# Patient Record
Sex: Female | Born: 1985
Health system: Southern US, Community
[De-identification: ages and names within clinical notes are randomized; demographics above are authoritative.]

## PROBLEM LIST (undated history)

## (undated) ENCOUNTER — Inpatient Hospital Stay (HOSPITAL_COMMUNITY): Payer: Self-pay

## (undated) DIAGNOSIS — R519 Headache, unspecified: Secondary | ICD-10-CM

## (undated) DIAGNOSIS — N390 Urinary tract infection, site not specified: Secondary | ICD-10-CM

## (undated) DIAGNOSIS — A599 Trichomoniasis, unspecified: Secondary | ICD-10-CM

## (undated) DIAGNOSIS — L732 Hidradenitis suppurativa: Secondary | ICD-10-CM

## (undated) DIAGNOSIS — G40909 Epilepsy, unspecified, not intractable, without status epilepticus: Secondary | ICD-10-CM

## (undated) HISTORY — PX: BARTHOLIN CYST MARSUPIALIZATION: SHX5383

---
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
## 2012-01-02 ENCOUNTER — Encounter (HOSPITAL_COMMUNITY): Payer: Self-pay | Admitting: *Deleted

## 2012-01-02 ENCOUNTER — Emergency Department (HOSPITAL_COMMUNITY)
Admission: EM | Admit: 2012-01-02 | Discharge: 2012-01-02 | Disposition: A | Discharge status: Home / Self Care | Payer: Medicaid Other | Attending: Emergency Medicine | Admitting: Emergency Medicine

## 2012-01-02 DIAGNOSIS — N390 Urinary tract infection, site not specified: Secondary | ICD-10-CM | POA: Insufficient documentation

## 2012-01-02 DIAGNOSIS — G40909 Epilepsy, unspecified, not intractable, without status epilepticus: Secondary | ICD-10-CM | POA: Insufficient documentation

## 2012-01-02 HISTORY — DX: Epilepsy, unspecified, not intractable, without status epilepticus: G40.909

## 2012-01-02 LAB — URINE MICROSCOPIC-ADD ON

## 2012-01-02 LAB — URINALYSIS, ROUTINE W REFLEX MICROSCOPIC
Bilirubin Urine: NEGATIVE
Glucose, UA: NEGATIVE mg/dL
Hgb urine dipstick: NEGATIVE
Ketones, ur: 15 mg/dL — AB
Nitrite: NEGATIVE
Specific Gravity, Urine: 1.034 — ABNORMAL HIGH (ref 1.005–1.030)
pH: 6.5 (ref 5.0–8.0)

## 2012-01-02 MED ORDER — CIPROFLOXACIN HCL 500 MG PO TABS: 500.0000 mg | ORAL_TABLET | Freq: Two times a day (BID) | ORAL | Status: AC

## 2012-01-02 NOTE — ED Notes (Signed)
Patient C/O left flank pain for 2 days. States that it gets worse with exertion. Denies Hematuria.

## 2012-01-02 NOTE — Discharge Instructions (Signed)

## 2012-01-02 NOTE — ED Notes (Signed)
Patient with complaints of lower back pain and nausea for 2 to 3 days.  She reports her last period was may 1st.  Patient is not on birth control

## 2012-01-02 NOTE — ED Provider Notes (Signed)
History  This chart was scribed for Shannon Quarry, MD by Bennett Scrape. This patient was seen in room STRE7/STRE7 and the patient's care was started at 1:54PM.  CSN: [id number]  Arrival date & time 01/02/12  1339   First MD Initiated Contact with Patient 01/02/12 1354      Chief Complaint  Patient presents with  . Back Pain  . Nausea    The history is provided by the patient. No language interpreter was used.    Shannon Velasquez is a 26 y.o. female who presents to the Emergency Department complaining of 3 days of gradual onset, gradually worsening, interment left-sided lower back pain with associated intermittent nausea. The pain comes on with movement. She has not taken any OTC medications to improve her pain. She denies having prior episodes. Pt reports that her LNMP was 11/19/11 but she denies the possibility of being pregnant. She denies being on birth control currently and is sexually active but denies recent intercourse She reports G1P1 and states that she is not having similar pregnancy symptoms that she experienced with her first pregnancy. She denies emesis and urinary symptoms. She has a h/o epilepsy. She states that she was taking phenobarbital but was taken off of her medication last month. She denies smoking and alcohol use.  Dr. Vonna Kotyk is PCP  Past Medical History  Diagnosis Date  . Epilepsy     History reviewed. No pertinent past surgical history.  No family history on file.  History  Substance Use Topics  . Smoking status: Never Smoker   . Smokeless tobacco: Not on file  . Alcohol Use: No     Review of Systems  Constitutional: Negative for fever and chills.  Gastrointestinal: Positive for nausea. Negative for vomiting.  Musculoskeletal: Positive for back pain.    Allergies  Penicillins  Home Medications  No current outpatient prescriptions on file.  Triage Vitals: BP 112/70  Pulse 96  Temp 97.9 F (36.6 C) (Oral)  Resp 18  Ht 5\' 3"  (1.6 m)   Wt 190 lb (86.183 kg)  BMI 33.66 kg/m2  SpO2 98%  Physical Exam  Nursing note and vitals reviewed. Constitutional: She is oriented to person, place, and time. She appears well-developed and well-nourished. No distress.  HENT:  Head: Normocephalic and atraumatic.  Eyes: EOM are normal.  Neck: Neck supple. No tracheal deviation present.  Cardiovascular: Normal rate.   Pulmonary/Chest: Effort normal. No respiratory distress.  Musculoskeletal: Normal range of motion.  Neurological: She is alert and oriented to person, place, and time.  Skin: Skin is warm and dry.  Psychiatric: She has a normal mood and affect. Her behavior is normal.    ED Course  Procedures (including critical care time)  DIAGNOSTIC STUDIES: Oxygen Saturation is 98% on room air, normal by my interpretation.    COORDINATION OF CARE: 2:00PM-Discussed treatment plan with pt and pt agreed to plan.  Labs Reviewed  URINALYSIS, ROUTINE W REFLEX MICROSCOPIC - Abnormal; Notable for the following:    APPearance CLOUDY (*)     Specific Gravity, Urine 1.034 (*)     Ketones, ur 15 (*)     Leukocytes, UA LARGE (*)     All other components within normal limits  URINE MICROSCOPIC-ADD ON - Abnormal; Notable for the following:    Squamous Epithelial / LPF MANY (*)     Bacteria, UA MANY (*)     All other components within normal limits  POCT PREGNANCY, URINE   No results found.  No diagnosis found.    Possible uti with patient with flank pain- plan cipro.  Patient with negative pregnancy test.   I personally performed the services described in this documentation, which was scribed in my presence. The recorded information has been reviewed and considered.   Shannon Quarry, MD 01/02/12 [phone]

## 2012-01-02 NOTE — ED Notes (Signed)
Pt discharged home. Had no further questions at the time. 

## 2012-04-30 ENCOUNTER — Emergency Department (HOSPITAL_BASED_OUTPATIENT_CLINIC_OR_DEPARTMENT_OTHER)
Admission: EM | Admit: 2012-04-30 | Discharge: 2012-04-30 | Disposition: A | Discharge status: Home / Self Care | Payer: Medicaid Other | Attending: Emergency Medicine | Admitting: Emergency Medicine

## 2012-04-30 ENCOUNTER — Encounter (HOSPITAL_BASED_OUTPATIENT_CLINIC_OR_DEPARTMENT_OTHER): Payer: Self-pay | Admitting: *Deleted

## 2012-04-30 DIAGNOSIS — N926 Irregular menstruation, unspecified: Secondary | ICD-10-CM | POA: Insufficient documentation

## 2012-04-30 DIAGNOSIS — M549 Dorsalgia, unspecified: Secondary | ICD-10-CM | POA: Insufficient documentation

## 2012-04-30 DIAGNOSIS — R110 Nausea: Secondary | ICD-10-CM | POA: Insufficient documentation

## 2012-04-30 LAB — URINALYSIS, ROUTINE W REFLEX MICROSCOPIC
Glucose, UA: NEGATIVE mg/dL
Ketones, ur: NEGATIVE mg/dL
Leukocytes, UA: NEGATIVE
Protein, ur: NEGATIVE mg/dL
Urobilinogen, UA: 1 mg/dL (ref 0.0–1.0)

## 2012-04-30 NOTE — ED Notes (Signed)
Back pain and nausea. Irregular menses. States she took a pregnancy test and it was negative.

## 2012-04-30 NOTE — ED Provider Notes (Signed)
Pt left from ED prior to my evaluation  Rolan Bucco, MD 04/30/12 [phone]

## 2012-04-30 NOTE — ED Notes (Signed)
Pt ambulated to desk stating that the rest of her party was ready to go so she would be leaving now and return if pain worsened. NAD noted, pt ambulated out of dept with a steady gait.

## 2012-10-20 ENCOUNTER — Emergency Department (HOSPITAL_BASED_OUTPATIENT_CLINIC_OR_DEPARTMENT_OTHER)
Admission: EM | Admit: 2012-10-20 | Discharge: 2012-10-20 | Disposition: A | Discharge status: Home / Self Care | Payer: Medicaid Other | Attending: Emergency Medicine | Admitting: Emergency Medicine

## 2012-10-20 ENCOUNTER — Encounter (HOSPITAL_BASED_OUTPATIENT_CLINIC_OR_DEPARTMENT_OTHER): Payer: Self-pay | Admitting: *Deleted

## 2012-10-20 DIAGNOSIS — Z8669 Personal history of other diseases of the nervous system and sense organs: Secondary | ICD-10-CM | POA: Insufficient documentation

## 2012-10-20 DIAGNOSIS — M549 Dorsalgia, unspecified: Secondary | ICD-10-CM | POA: Insufficient documentation

## 2012-10-20 DIAGNOSIS — Z3202 Encounter for pregnancy test, result negative: Secondary | ICD-10-CM | POA: Insufficient documentation

## 2012-10-20 DIAGNOSIS — M546 Pain in thoracic spine: Secondary | ICD-10-CM | POA: Insufficient documentation

## 2012-10-20 DIAGNOSIS — N390 Urinary tract infection, site not specified: Secondary | ICD-10-CM | POA: Insufficient documentation

## 2012-10-20 LAB — URINALYSIS, ROUTINE W REFLEX MICROSCOPIC
Bilirubin Urine: NEGATIVE
Hgb urine dipstick: NEGATIVE
Ketones, ur: 15 mg/dL — AB
Specific Gravity, Urine: 1.03 (ref 1.005–1.030)
Urobilinogen, UA: 1 mg/dL (ref 0.0–1.0)
pH: 7.5 (ref 5.0–8.0)

## 2012-10-20 LAB — URINE MICROSCOPIC-ADD ON

## 2012-10-20 MED ORDER — IBUPROFEN 800 MG PO TABS: 800.0000 mg | ORAL_TABLET | Freq: Three times a day (TID) | ORAL | Status: DC

## 2012-10-20 MED ORDER — CEPHALEXIN 500 MG PO CAPS: 500.0000 mg | ORAL_CAPSULE | Freq: Four times a day (QID) | ORAL | Status: DC

## 2012-10-20 NOTE — ED Notes (Signed)
Pt c/o upper back pain x 1 week

## 2012-10-20 NOTE — ED Provider Notes (Signed)
History     CSN: [id number]  Arrival date & time 10/20/12  1903   First MD Initiated Contact with Patient 10/20/12 1911      Chief Complaint  Patient presents with  . Back Pain    (Consider location/radiation/quality/duration/timing/severity/associated sxs/prior treatment) Patient is a 27 y.o. female presenting with back pain. The history is provided by the patient. No language interpreter was used.  Back Pain Location:  Thoracic spine and lumbar spine Quality:  Aching Pain severity:  Moderate Pain is:  Same all the time Duration:  1 week Timing:  Constant Progression:  Worsening Chronicity:  New Worsened by:  Nothing tried Ineffective treatments:  None tried Associated symptoms: no abdominal pain     Past Medical History  Diagnosis Date  . Epilepsy     History reviewed. No pertinent past surgical history.  History reviewed. No pertinent family history.  History  Substance Use Topics  . Smoking status: Never Smoker   . Smokeless tobacco: Not on file  . Alcohol Use: No    OB History   Grav Para Term Preterm Abortions TAB SAB Ect Mult Living                  Review of Systems  Gastrointestinal: Negative for abdominal pain.  Musculoskeletal: Positive for back pain.  All other systems reviewed and are negative.    Allergies  Penicillins  Home Medications  No current outpatient prescriptions on file.  BP 121/83  Pulse 106  Temp(Src) 98 F (36.7 C) (Oral)  Resp 20  Ht 5\' 3"  (1.6 m)  Wt 197 lb (89.359 kg)  BMI 34.91 kg/m2  SpO2 98%  LMP 09/18/2012  Physical Exam  Nursing note and vitals reviewed. Constitutional: She is oriented to person, place, and time. She appears well-developed and well-nourished.  HENT:  Head: Normocephalic.  Eyes: Pupils are equal, round, and reactive to light.  Neck: Normal range of motion.  Cardiovascular: Normal rate and normal heart sounds.   Pulmonary/Chest: Effort normal.  Abdominal: Soft.  Musculoskeletal:   Tender thoracic area diffusely  Neurological: She is alert and oriented to person, place, and time.  Skin: Skin is warm.  Psychiatric: She has a normal mood and affect.    ED Course  Procedures (including critical care time)  Labs Reviewed  URINALYSIS, ROUTINE W REFLEX MICROSCOPIC - Abnormal; Notable for the following:    APPearance CLOUDY (*)    Ketones, ur 15 (*)    Protein, ur 30 (*)    Leukocytes, UA MODERATE (*)    All other components within normal limits  URINE MICROSCOPIC-ADD ON - Abnormal; Notable for the following:    Squamous Epithelial / LPF MANY (*)    Bacteria, UA FEW (*)    All other components within normal limits  URINE CULTURE  PREGNANCY, URINE   No results found.   No diagnosis found.    MDM   Results for orders placed during the hospital encounter of 10/20/12  URINALYSIS, ROUTINE W REFLEX MICROSCOPIC      Result Value Range   Color, Urine YELLOW  YELLOW   APPearance CLOUDY (*) CLEAR   Specific Gravity, Urine 1.030  1.005 - 1.030   pH 7.5  5.0 - 8.0   Glucose, UA NEGATIVE  NEGATIVE mg/dL   Hgb urine dipstick NEGATIVE  NEGATIVE   Bilirubin Urine NEGATIVE  NEGATIVE   Ketones, ur 15 (*) NEGATIVE mg/dL   Protein, ur 30 (*) NEGATIVE mg/dL   Urobilinogen, UA 1.0  0.0 - 1.0 mg/dL   Nitrite NEGATIVE  NEGATIVE   Leukocytes, UA MODERATE (*) NEGATIVE  PREGNANCY, URINE      Result Value Range   Preg Test, Ur NEGATIVE  NEGATIVE  URINE MICROSCOPIC-ADD ON      Result Value Range   Squamous Epithelial / LPF MANY (*) RARE   WBC, UA 7-10  <3 WBC/hpf   Bacteria, UA FEW (*) RARE   Urine-Other MUCOUS PRESENT     No results found.     Pt counseled on uti.  I will cover with keflex.   Ibuprofen for pain.   Pt advised to follow up with her MD for recheck    Elson Areas, PA-C 10/20/12 2201

## 2012-10-20 NOTE — ED Provider Notes (Signed)
Medical screening examination/treatment/procedure(s) were performed by non-physician practitioner and as supervising physician I was immediately available for consultation/collaboration.  Ethelda Chick, MD 10/20/12 2215

## 2012-10-22 LAB — URINE CULTURE

## 2013-02-11 DIAGNOSIS — G43909 Migraine, unspecified, not intractable, without status migrainosus: Secondary | ICD-10-CM | POA: Insufficient documentation

## 2013-06-04 ENCOUNTER — Emergency Department (HOSPITAL_BASED_OUTPATIENT_CLINIC_OR_DEPARTMENT_OTHER)
Admission: EM | Admit: 2013-06-04 | Discharge: 2013-06-04 | Disposition: A | Discharge status: Home / Self Care | Payer: Medicaid Other | Attending: Emergency Medicine | Admitting: Emergency Medicine

## 2013-06-04 ENCOUNTER — Encounter (HOSPITAL_BASED_OUTPATIENT_CLINIC_OR_DEPARTMENT_OTHER): Payer: Self-pay | Admitting: Emergency Medicine

## 2013-06-04 DIAGNOSIS — N644 Mastodynia: Secondary | ICD-10-CM | POA: Insufficient documentation

## 2013-06-04 DIAGNOSIS — Z880 Allergy status to penicillin: Secondary | ICD-10-CM | POA: Insufficient documentation

## 2013-06-04 DIAGNOSIS — Z3202 Encounter for pregnancy test, result negative: Secondary | ICD-10-CM | POA: Insufficient documentation

## 2013-06-04 DIAGNOSIS — Z8669 Personal history of other diseases of the nervous system and sense organs: Secondary | ICD-10-CM | POA: Insufficient documentation

## 2013-06-04 MED ORDER — CLOTRIMAZOLE 1 % EX CREA: TOPICAL_CREAM | CUTANEOUS | Status: DC

## 2013-06-04 NOTE — ED Provider Notes (Signed)
Medical screening examination/treatment/procedure(s) were performed by non-physician practitioner and as supervising physician I was immediately available for consultation/collaboration.  EKG Interpretation   None         Shanna Cisco, MD 06/04/13 2030

## 2013-06-04 NOTE — ED Notes (Addendum)
Patient here with bilateral breast tenderness around both nipples. Reports that area feels dry, denies discharge from nipples. Denies trauma.

## 2013-06-04 NOTE — ED Provider Notes (Signed)
CSN: [id number]     Arrival date & time 06/04/13  1225 History   First MD Initiated Contact with Patient 06/04/13 1304     Chief Complaint  Patient presents with  . Breast Pain   (Consider location/radiation/quality/duration/timing/severity/associated sxs/prior Treatment) Patient is a 27 y.o. female presenting with chest pain. The history is provided by the patient. No language interpreter was used.  Chest Pain Associated symptoms comment:  She complains of breast soreness at the nipple of both breasts for the past 2 weeks. No discharge. No masses that she is aware of. She is not breast feeding. No other symptoms.   Past Medical History  Diagnosis Date  . Epilepsy    History reviewed. No pertinent past surgical history. No family history on file. History  Substance Use Topics  . Smoking status: Never Smoker   . Smokeless tobacco: Not on file  . Alcohol Use: No   OB History   Grav Para Term Preterm Abortions TAB SAB Ect Mult Living                 Review of Systems  Cardiovascular: Positive for chest pain.       See HPI.    Allergies  Penicillins  Home Medications  No current outpatient prescriptions on file. BP 117/69  Pulse 81  Temp(Src) 98.1 F (36.7 C) (Oral)  Resp 16  SpO2 97%  LMP 05/10/2013 Physical Exam  Pulmonary/Chest:  Bilateral breast exam unremarkable, no discoloration, warmth, discoloration, nipple discharge or bleeding, or palpable mass. [place] bilaterally have cutaneous scaling, dry skin.     ED Course  Procedures (including critical care time) Labs Review Labs Reviewed  PREGNANCY, URINE   Imaging Review No results found.  EKG Interpretation   None       MDM  No diagnosis found. 1. Breast discomfort  Suspect possible fungal or yeast component to symptoms. No concerning exam findings. Encouraged GYN follow up for routine female health, including mammogram.    Arnoldo Hooker, PA-C 06/04/13 1411

## 2013-08-16 ENCOUNTER — Emergency Department (HOSPITAL_BASED_OUTPATIENT_CLINIC_OR_DEPARTMENT_OTHER)
Admission: EM | Admit: 2013-08-16 | Discharge: 2013-08-17 | Disposition: A | Discharge status: Home / Self Care | Payer: Medicaid Other | Attending: Emergency Medicine | Admitting: Emergency Medicine

## 2013-08-16 ENCOUNTER — Encounter (HOSPITAL_BASED_OUTPATIENT_CLINIC_OR_DEPARTMENT_OTHER): Payer: Self-pay | Admitting: Emergency Medicine

## 2013-08-16 DIAGNOSIS — Z880 Allergy status to penicillin: Secondary | ICD-10-CM | POA: Insufficient documentation

## 2013-08-16 DIAGNOSIS — R197 Diarrhea, unspecified: Secondary | ICD-10-CM | POA: Insufficient documentation

## 2013-08-16 DIAGNOSIS — R112 Nausea with vomiting, unspecified: Secondary | ICD-10-CM | POA: Insufficient documentation

## 2013-08-16 DIAGNOSIS — Z8669 Personal history of other diseases of the nervous system and sense organs: Secondary | ICD-10-CM | POA: Insufficient documentation

## 2013-08-16 DIAGNOSIS — Z79899 Other long term (current) drug therapy: Secondary | ICD-10-CM | POA: Insufficient documentation

## 2013-08-16 NOTE — ED Notes (Signed)
Diarrhea off and on x 1 week,  denies abd pain

## 2013-08-16 NOTE — ED Notes (Signed)
C/o HA, diarrhea x 1 week-vomiting x 2 days

## 2013-08-17 NOTE — Discharge Instructions (Signed)
Diarrhea Diarrhea is frequent loose and watery bowel movements. It can cause you to feel weak and dehydrated. Dehydration can cause you to become tired and thirsty, have a dry mouth, and have decreased urination that often is dark yellow. Diarrhea is a sign of another problem, most often an infection that will not last long. In most cases, diarrhea typically lasts 2 3 days. However, it can last longer if it is a sign of something more serious. It is important to treat your diarrhea as directed by your caregive to lessen or prevent future episodes of diarrhea. CAUSES  Some common causes include:  Gastrointestinal infections caused by viruses, bacteria, or parasites.  Food poisoning or food allergies.  Certain medicines, such as antibiotics, chemotherapy, and laxatives.  Artificial sweeteners and fructose.  Digestive disorders. HOME CARE INSTRUCTIONS  Ensure adequate fluid intake (hydration): have 1 cup (8 oz) of fluid for each diarrhea episode. Avoid fluids that contain simple sugars or sports drinks, fruit juices, whole milk products, and sodas. Your urine should be clear or pale yellow if you are drinking enough fluids. Hydrate with an oral rehydration solution that you can purchase at pharmacies, retail stores, and online. You can prepare an oral rehydration solution at home by mixing the following ingredients together:    tsp table salt.   tsp baking soda.   tsp salt substitute containing potassium chloride.  1  tablespoons sugar.  1 L (34 oz) of water.  Certain foods and beverages may increase the speed at which food moves through the gastrointestinal (GI) tract. These foods and beverages should be avoided and include:  Caffeinated and alcoholic beverages.  High-fiber foods, such as raw fruits and vegetables, nuts, seeds, and whole grain breads and cereals.  Foods and beverages sweetened with sugar alcohols, such as xylitol, sorbitol, and mannitol.  Some foods may be well  tolerated and may help thicken stool including:  Starchy foods, such as rice, toast, pasta, low-sugar cereal, oatmeal, grits, baked potatoes, crackers, and bagels.  Bananas.  Applesauce.  Add probiotic-rich foods to help increase healthy bacteria in the GI tract, such as yogurt and fermented milk products.  Wash your hands well after each diarrhea episode.  Only take over-the-counter or prescription medicines as directed by your caregiver.  Take a warm bath to relieve any burning or pain from frequent diarrhea episodes. SEEK IMMEDIATE MEDICAL CARE IF:   You are unable to keep fluids down.  You have persistent vomiting.  You have blood in your stool, or your stools are black and tarry.  You do not urinate in 6 8 hours, or there is only a small amount of very dark urine.  You have abdominal pain that increases or localizes.  You have weakness, dizziness, confusion, or lightheadedness.  You have a severe headache.  Your diarrhea gets worse or does not get better.  You have a fever or persistent symptoms for more than 2 3 days.  You have a fever and your symptoms suddenly get worse. MAKE SURE YOU:   Understand these instructions.  Will watch your condition.  Will get help right away if you are not doing well or get worse. Document Released: 06/27/2002 Document Revised: 06/23/2012 Document Reviewed: 03/14/2012 [facility] Patient Information 2014 [place], [place].   Emergency Department Resource Guide 1) Find a Doctor and Pay Out of Pocket Although you won't have to find out who is covered by your insurance plan, it is a good idea to ask around and get recommendations. You will then  need to call the office and see if the doctor you have chosen will accept you as a new patient and what types of options they offer for patients who are self-pay. Some doctors offer discounts or will set up payment plans for their patients who do not have insurance, but you will need to ask so you  aren't surprised when you get to your appointment.  2) Contact Your Local Health Department Not all health departments have doctors that can see patients for sick visits, but many do, so it is worth a call to see if yours does. If you don't know where your local health department is, you can check in your phone book. The CDC also has a tool to help you locate your state's health department, and many state websites also have listings of all of their local health departments.  3) Find a Walk-in Clinic If your illness is not likely to be very severe or complicated, you may want to try a walk in clinic. These are popping up all over the country in pharmacies, drugstores, and shopping centers. They're usually staffed by nurse practitioners or physician assistants that have been trained to treat common illnesses and complaints. They're usually fairly quick and inexpensive. However, if you have serious medical issues or chronic medical problems, these are probably not your best option.  No Primary Care Doctor: - Call Health Connect at  [phone] - they can help you locate a primary care doctor that  accepts your insurance, provides certain services, etc. - Physician Referral Service- [phone]  Chronic Pain Problems: Organization         Address  Phone   Notes  [facility]  [phone]) [phone] Patients need to be referred by their primary care doctor.   Medication Assistance: Organization         Address  Phone   Notes  [facility] [address] [place], [place] [id number] [phone]) [phone] --Must be a resident of [place] [facility] [place] -- Must have NO insurance coverage whatsoever (no Medicaid/ Medicare, etc.) -- The pt. MUST have a primary care doctor that directs their care regularly and follows them in the community   Med[facility]ist  [phone]   [employer]  [phone]) [phone]    Agencies that provide inexpensive medical care: Organization          Address  Phone   Notes  [facility]  [phone]) [phone]   [facility]    [phone]) [phone]   [facility] [address] [place], [place] [address] [phone]) [phone]   [facility] [address], [place] [phone]) [phone]   [facility]anned Parenthood    [phone]   [facility]    [phone]   [facility]  [address], [place] Phone:  [phone](336) [phone], Fax:  [phone] Hours of Operation:  9 am - 6 pm, M-F.  Also accepts Medicaid/Medicare and self-pay.  [facility]  [address], [place] Phone: [phone](336) [phone], Fax: [phone]. Hours of Operation:  8:30 am - 5:30 pm, M-F.  Also accepts Medicaid and self-pay.  [facility] [place] [address], [place] Phone: [phone](336) [phone]   [facility] [address], [place], [place] [phone], Ext. [phone] Mondays & Thursdays: 7-9 AM.  First 15 patients are seen on a first come, first serve basis.  [place] Providers:  Organization         Address  Phone   Notes  [facility] [address], [place] [phone] Also accepts self-pay patients.  [facility] [id number] [place], [place]  [phone]   [place], [address], [place] [phone]   [facility] [address], [place] [phone]   Lucianne Lei [address], [place]   [phone] Only accepts [place] Access [place] patients after they have their name applied to their card.   Self-Pay (no insurance) in [facility]:  Organization         Address  Phone   Notes  Sickle Cell Patients, [facility] [place] [phone]   [facility] [place] [phone]    [facility] [place]  [place], [place], [place] [phone]   [facility]/Dr. Osei-Bonsu  [address], [place] or [address], [place] [phone] Phone number for both [place] and [place] locations is the same.  Urgent Medical and [facility] [address], [place] [phone]   [facility] [address] [place] or [address] [phone]   [facility] [address] [place] [phone], phone; [phone], fax Sees patients 1st and 3rd Saturday of every month.  Must not qualify for public or private insurance (i.e. Medicaid, Medicare, [place] Health Choice, Veterans' Benefits)  Household income should be no more than 200% of the poverty level The clinic cannot treat you if you are pregnant or think you are pregnant  Sexually transmitted diseases are not treated at the clinic.    Dental Care: Organization         Address  Phone  Notes  [facility] Department of [facility] [place] [phone] Accepts children up to age 48 who are enrolled in [place] or [place]; pregnant women with a Medicaid card; and children who have applied for Medicaid or [place] Health Choice, but were declined, whose parents can pay a reduced fee at time of service.  [facility] Department of [facility]  [address], [place] [phone] Accepts children up to age 18 who are enrolled in [place] or Mariano Colon; pregnant women with a Medicaid card; and children who have applied for Medicaid or [place] Health Choice, but were declined, whose parents can pay a reduced fee at time of service.  [place] [facility] Access PRO[facility]  [place] [phone] Patients are seen by appointment only. Walk-ins are not accepted. [place] will see patients 20  years of age and older. Monday - Tuesday (8am-5pm) Most Wednesdays (8:30-5pm) $30 per visit, cash only  [facility] Access [facility]  [address], [facility] [phone] Patients are seen by appointment only. Walk-ins are not accepted. [place] will see patients 36 years of age and older. One Wednesday Evening (Monthly: Volunteer Based).  $30 per visit, cash only  [place]  [phone] for adults; Children under age 49, call Graduate Pediatric Dentistry at [phone]. Children aged 3-14, please call [phone] to request a  pediatric application.  Dental services are provided in all areas of dental care including fillings, crowns and bridges, complete and partial dentures, implants, gum treatment, root canals, and extractions. Preventive care is also provided. Treatment is provided to both adults and children. Patients are selected via a lottery and there is often a waiting list.   [facility] [address], [place]  [phone] [email or web address]   [facility] [address] [place], [place] [phone], Ext. [phone] Second and Fourth Thursday of each month, opens at 6:30 AM; Clinic ends at 9 AM.  Patients are seen on a first-come first-served basis, and a limited number are seen during each clinic.   [facility]  [address] [place], [place] [phone]   Eligibility Requirements You must have lived in [place], [place], or [place] counties for at least the last three months.   You cannot be eligible for state or federal sponsored [employer], including [employer], [place], or [employer].   You generally cannot be eligible for healthcare insurance through your employer.    How to apply: Eligibility screenings are held every Tuesday and Wednesday afternoon from 1:00 pm until 4:00 pm. You do not need an appointment for the interview!  [facility]  [address] [place], [place]   [place]  [place] Department  [place]  [phone]    Behavioral Health Resources in the Community: Intensive Outpatient Programs Organization         Address  Phone  Notes  [place]. [address], [place], [place] [phone]   [facility] Outpatient [address], [place], [place]   ADS: [facility] [address], [place], [place]   [place] [address],  [place], [place] or [phone]   Substance Abuse Resources Organization         Address  Phone  Notes  Alcohol [facility] Drug Services  [phone]   [place]  [phone]   [facility] [place]   Chinita Pester  [phone]   Residential & Outpatient Substance Abuse Program  [phone]   Psychological Services Organization         Address  Phone  Notes  [facility]  [place]  [phone]   [place] [address], [place] or [phone]    Mobile Crisis Teams Organization         Address  Phone  Notes  [facility], Mobile Crisis Care Unit  [phone]   [facility]  [address] [place], [place]   [place] Levels [address], [place] [phone]    Self-Help/Support Groups Organization         Address  Phone             Notes  Betania Dizon. of [place] - variety of support groups  [place] Call for more information  Narcotics Anonymous (NA), [facility] [address], [employer] [place]  2 meetings at this location   Brewing technologist  Notes  [facility] [place],    [place]   [phone]   [place], [place]  [address], [place], [place] [phone]   °[facility] [address], [place] [phone] Admissions: 8am-3pm M-F  °[facility] [address]    °[place], [place] [phone]   °[facility] [address], [place], [place] [phone]   °[facility] [address]  °[place], [place] [phone]   °[facility] - Intensive Outpatient [address], [place], [place] [phone]   °[facility] [address]  °[place], [place] 1-877-615-2722 or [phone]   °[facility] ([facility]) [address] [place], [place] [phone] Accepts Medicaid  °[facility] [address]  °[place] 1-800-659-3381 Substance Abuse/Addiction Treatment  ° °[place] Behavioral Health Resources °Organization         Address  Phone  Notes  °[facility]  (888) [phone]   °Julie Brannon, PhD [address] [place], [place]   ([phone]) [phone] or (336) 951-0000   °[facility]   [address] °[place], [place] ([phone]) [phone]   °[facility] [address], [place], [place] (336) [phone] Insurance/Medicaid/sponsorship through [facility]  °[facility] [address]                                    [place], [place] ([phone]) [phone] Therapy/tele-psych/case  °[facility] [address]  ° [place], [place] (336) [phone]    °Dr. Arfeen  ([phone]) [phone]   °[facility] [place]  [employer] [place] [facility] 1) [address], [place] °2) [address], [place] °3)  [address], [place] ([phone]) [phone] °(336) [phone] ° °(336) [phone]   °[place] Child Abuse Hotline (336) 342-1394 or (336) 342-3537 (After Hours)    ° ° ° °

## 2013-08-17 NOTE — ED Provider Notes (Signed)
CSN: [id number]     Arrival date & time 08/16/13  2109 History   First MD Initiated Contact with Patient 08/16/13 2349     Chief Complaint  Patient presents with  . Diarrhea   (Consider location/radiation/quality/duration/timing/severity/associated sxs/prior Treatment) Patient is a 28 y.o. female presenting with diarrhea. The history is provided by the patient.  Diarrhea Quality:  Semi-solid Severity:  Mild Onset quality:  Gradual Number of episodes:  4 Duration:  6 days Timing:  Constant Progression:  Unchanged Relieved by:  Nothing Worsened by:  Nothing tried Ineffective treatments:  None tried Associated symptoms: vomiting (once)   Associated symptoms: no abdominal pain, no chills and no fever   Risk factors: no recent antibiotic use, no sick contacts, no suspicious food intake and no travel to endemic areas     Past Medical History  Diagnosis Date  . Epilepsy    History reviewed. No pertinent past surgical history. No family history on file. History  Substance Use Topics  . Smoking status: Never Smoker   . Smokeless tobacco: Not on file  . Alcohol Use: No   OB History   Grav Para Term Preterm Abortions TAB SAB Ect Mult Living                 Review of Systems  Constitutional: Negative for fever and chills.  Gastrointestinal: Positive for nausea, vomiting (once) and diarrhea. Negative for abdominal pain.  All other systems reviewed and are negative.    Allergies  Penicillins  Home Medications   Current Outpatient Rx  Name  Route  Sig  Dispense  Refill  . clotrimazole (LOTRIMIN) 1 % cream      Apply to affected area 2 times daily   15 g   0    BP 131/88  Pulse 77  Temp(Src) 98.2 F (36.8 C) (Oral)  Resp 20  Ht 5\' 3"  (1.6 m)  Wt 190 lb (86.183 kg)  BMI 33.67 kg/m2  SpO2 100%  LMP 07/20/2013 Physical Exam  Nursing note and vitals reviewed. Constitutional: She is oriented to person, place, and time. She appears well-developed and  well-nourished. No distress.  HENT:  Head: Normocephalic and atraumatic.  Eyes: EOM are normal. Pupils are equal, round, and reactive to light.  Neck: Normal range of motion. Neck supple.  Cardiovascular: Normal rate and regular rhythm.  Exam reveals no friction rub.   No murmur heard. Pulmonary/Chest: Effort normal and breath sounds normal. No respiratory distress. She has no wheezes. She has no rales.  Abdominal: Soft. She exhibits no distension. There is no tenderness. There is no rebound.  Musculoskeletal: Normal range of motion. She exhibits no edema.  Neurological: She is alert and oriented to person, place, and time. No cranial nerve deficit. She exhibits normal muscle tone. Coordination normal.  Skin: No rash noted. She is not diaphoretic.    ED Course  Procedures (including critical care time) Labs Review Labs Reviewed - No data to display Imaging Review No results found.  EKG Interpretation   None       MDM   1. Diarrhea    28 year old female presents with diarrhea. Present for 6 days. Happened 4 times daily. No blood. No recent travel, camping, and diabetes. No abdominal pain. She's had one episode of vomiting today. No fevers. Here exam is benign. Mucous membranes are moist. No concern for dehydration. Instructed to try antimotility agents. No concern for dehydration, no need for labs her fluids. Patient stable for discharge.  Dagmar Hait, MD 08/17/13 Rich Fuchs

## 2013-09-27 ENCOUNTER — Encounter (HOSPITAL_BASED_OUTPATIENT_CLINIC_OR_DEPARTMENT_OTHER): Payer: Self-pay | Admitting: Emergency Medicine

## 2013-09-27 ENCOUNTER — Emergency Department (HOSPITAL_BASED_OUTPATIENT_CLINIC_OR_DEPARTMENT_OTHER)
Admission: EM | Admit: 2013-09-27 | Discharge: 2013-09-27 | Disposition: A | Discharge status: Home / Self Care | Payer: Medicaid Other | Attending: Emergency Medicine | Admitting: Emergency Medicine

## 2013-09-27 DIAGNOSIS — Z3490 Encounter for supervision of normal pregnancy, unspecified, unspecified trimester: Secondary | ICD-10-CM

## 2013-09-27 DIAGNOSIS — M545 Low back pain, unspecified: Secondary | ICD-10-CM | POA: Insufficient documentation

## 2013-09-27 DIAGNOSIS — O9989 Other specified diseases and conditions complicating pregnancy, childbirth and the puerperium: Secondary | ICD-10-CM | POA: Insufficient documentation

## 2013-09-27 DIAGNOSIS — M549 Dorsalgia, unspecified: Secondary | ICD-10-CM

## 2013-09-27 DIAGNOSIS — Z880 Allergy status to penicillin: Secondary | ICD-10-CM | POA: Insufficient documentation

## 2013-09-27 DIAGNOSIS — Z8669 Personal history of other diseases of the nervous system and sense organs: Secondary | ICD-10-CM | POA: Insufficient documentation

## 2013-09-27 DIAGNOSIS — R110 Nausea: Secondary | ICD-10-CM | POA: Insufficient documentation

## 2013-09-27 DIAGNOSIS — M546 Pain in thoracic spine: Secondary | ICD-10-CM | POA: Insufficient documentation

## 2013-09-27 DIAGNOSIS — Z79899 Other long term (current) drug therapy: Secondary | ICD-10-CM | POA: Insufficient documentation

## 2013-09-27 LAB — URINE MICROSCOPIC-ADD ON

## 2013-09-27 LAB — URINALYSIS, ROUTINE W REFLEX MICROSCOPIC
BILIRUBIN URINE: NEGATIVE
Glucose, UA: NEGATIVE mg/dL
HGB URINE DIPSTICK: NEGATIVE
KETONES UR: NEGATIVE mg/dL
NITRITE: NEGATIVE
PROTEIN: NEGATIVE mg/dL
SPECIFIC GRAVITY, URINE: 1.027 (ref 1.005–1.030)
UROBILINOGEN UA: 2 mg/dL — AB (ref 0.0–1.0)
pH: 6 (ref 5.0–8.0)

## 2013-09-27 LAB — PREGNANCY, URINE: Preg Test, Ur: POSITIVE — AB

## 2013-09-27 MED ORDER — CYCLOBENZAPRINE HCL 10 MG PO TABS: 10.0000 mg | ORAL_TABLET | Freq: Two times a day (BID) | ORAL | Status: DC | PRN

## 2013-09-27 MED ORDER — ACETAMINOPHEN 500 MG PO TABS: 1000.0000 mg | ORAL_TABLET | Freq: Three times a day (TID) | ORAL | Status: DC | PRN

## 2013-09-27 MED ORDER — ACETAMINOPHEN 500 MG PO TABS
ORAL_TABLET | ORAL | Status: AC
  Filled 2013-09-27: qty 2

## 2013-09-27 MED ORDER — ACETAMINOPHEN 500 MG PO TABS
1000.0000 mg | ORAL_TABLET | Freq: Once | ORAL | Status: AC
  Administered 2013-09-27: 1000 mg via ORAL

## 2013-09-27 NOTE — Discharge Instructions (Signed)

## 2013-09-27 NOTE — ED Notes (Signed)
MD at bedside. 

## 2013-09-27 NOTE — ED Notes (Signed)
C/o pain to entire back x 1 week-denies injury-also concerned no period x 2 months

## 2013-09-27 NOTE — ED Provider Notes (Signed)
CSN: [id number]     Arrival date & time 09/27/13  1623 History  This chart was scribed for Dagmar Hait, MD by Blanchard Kelch, ED Scribe. The patient was seen in room MH07/MH07. Patient's care was started at 6:26 PM.    Chief Complaint  Patient presents with  . Back Pain     Patient is a 27 y.o. female presenting with back pain. The history is provided by the patient. No language interpreter was used.  Back Pain Location:  Lumbar spine and thoracic spine Quality: tightness. Radiates to:  Does not radiate Duration:  1 week Timing:  Intermittent Chronicity:  New Context: physical stress   Context: not falling   Relieved by:  Lying down Ineffective treatments:  None tried Associated symptoms: no dysuria, no fever, no numbness and no weakness     HPI Comments: Shannon Velasquez is a 28 y.o. female who presents to the Emergency Department complaining of intermittent, lower and mid bilateral back pain that began about a week ago. The pain seems to come on with exertion. She describes the pain as pressure and tightness.  The pain is improved by lying down. She reports associated nausea. She denies any recent falls. She denies taking any medication for the pain. She denies fever, vomiting, diarrhea, dysuria, hematuria, numbness, weakness, dizziness, vaginal bleeding or discharge or bowel or bladder incontinence. She has a history of epilepsy that she reports has resolved. The patient states her LNMP was in December.   Past Medical History  Diagnosis Date  . Epilepsy    History reviewed. No pertinent past surgical history. No family history on file. History  Substance Use Topics  . Smoking status: Never Smoker   . Smokeless tobacco: Not on file  . Alcohol Use: No   OB History   Grav Para Term Preterm Abortions TAB SAB Ect Mult Living                 Review of Systems  Constitutional: Negative for fever.  Gastrointestinal: Positive for nausea. Negative for vomiting and  diarrhea.  Genitourinary: Negative for dysuria, hematuria, vaginal bleeding and vaginal discharge.  Musculoskeletal: Positive for back pain.  Neurological: Negative for dizziness, weakness and numbness.  All other systems reviewed and are negative.    Allergies  Penicillins  Home Medications   Current Outpatient Rx  Name  Route  Sig  Dispense  Refill  . clotrimazole (LOTRIMIN) 1 % cream      Apply to affected area 2 times daily   15 g   0    Triage Vitals: BP 118/71  Pulse 82  Temp(Src) 98.9 F (37.2 C) (Oral)  Resp 16  Ht 5\' 3"  (1.6 m)  Wt 200 lb (90.719 kg)  BMI 35.44 kg/m2  SpO2 100%  LMP 07/19/2013  Physical Exam  Nursing note and vitals reviewed. Constitutional: She is oriented to person, place, and time. She appears well-developed and well-nourished. No distress.  HENT:  Head: Normocephalic and atraumatic.  Eyes: EOM are normal.  Neck: Neck supple. No tracheal deviation present.  Cardiovascular: Normal rate, regular rhythm and normal heart sounds.  Exam reveals no gallop and no friction rub.   No murmur heard. Pulmonary/Chest: Effort normal and breath sounds normal. No respiratory distress. She has no wheezes. She has no rales.  Abdominal: Soft. Bowel sounds are normal. She exhibits no distension. There is no tenderness.  Musculoskeletal: Normal range of motion.  Neurological: She is alert and oriented to person, place, and time.  Skin: Skin is warm and dry.  Psychiatric: She has a normal mood and affect. Her behavior is normal.    ED Course  Procedures (including critical care time)  DIAGNOSTIC STUDIES: Oxygen Saturation is 100% on room air, normal by my interpretation.    COORDINATION OF CARE: 6:32 PM -Recommend follow up with OB-GYN due to positive urine pregnancy test. Patient verbalizes understanding and agrees with treatment plan.    Labs Review Labs Reviewed  URINALYSIS, ROUTINE W REFLEX MICROSCOPIC - Abnormal; Notable for the following:     APPearance CLOUDY (*)    Urobilinogen, UA 2.0 (*)    Leukocytes, UA MODERATE (*)    All other components within normal limits  PREGNANCY, URINE - Abnormal; Notable for the following:    Preg Test, Ur POSITIVE (*)    All other components within normal limits  URINE MICROSCOPIC-ADD ON - Abnormal; Notable for the following:    Squamous Epithelial / LPF FEW (*)    Bacteria, UA FEW (*)    All other components within normal limits   Imaging Review No results found.   EKG Interpretation None      MDM   Final diagnoses:  Back pain  Pregnancy    17F here with back pain. No trauma. Intermittent, worse with activity. No red flags. C/w musculoskeletal back pain. Given muscle relaxers and tylenol Rx. Patient also found to be pregnant - instructed to f/u with OB.  I personally performed the services described in this documentation, which was scribed in my presence. The recorded information has been reviewed and is accurate.     Dagmar HaitWilliam Jakyrie Totherow, MD 09/27/13 2312

## 2013-11-28 ENCOUNTER — Encounter (HOSPITAL_BASED_OUTPATIENT_CLINIC_OR_DEPARTMENT_OTHER): Payer: Self-pay | Admitting: Emergency Medicine

## 2013-11-28 ENCOUNTER — Emergency Department (HOSPITAL_BASED_OUTPATIENT_CLINIC_OR_DEPARTMENT_OTHER)
Admission: EM | Admit: 2013-11-28 | Discharge: 2013-11-28 | Discharge status: Against Medical Advice | Payer: Medicaid Other | Attending: Emergency Medicine | Admitting: Emergency Medicine

## 2013-11-28 DIAGNOSIS — L02219 Cutaneous abscess of trunk, unspecified: Secondary | ICD-10-CM | POA: Insufficient documentation

## 2013-11-28 DIAGNOSIS — L03319 Cellulitis of trunk, unspecified: Principal | ICD-10-CM

## 2013-11-28 NOTE — ED Notes (Signed)
Pt reports 2 weeks of abscess on sacral area, no drainage.

## 2014-01-13 ENCOUNTER — Encounter (HOSPITAL_BASED_OUTPATIENT_CLINIC_OR_DEPARTMENT_OTHER): Payer: Self-pay | Admitting: Emergency Medicine

## 2014-01-13 ENCOUNTER — Emergency Department (HOSPITAL_BASED_OUTPATIENT_CLINIC_OR_DEPARTMENT_OTHER)
Admission: EM | Admit: 2014-01-13 | Discharge: 2014-01-13 | Disposition: A | Discharge status: Home / Self Care | Payer: Medicaid Other | Attending: Emergency Medicine | Admitting: Emergency Medicine

## 2014-01-13 DIAGNOSIS — Z79899 Other long term (current) drug therapy: Secondary | ICD-10-CM | POA: Insufficient documentation

## 2014-01-13 DIAGNOSIS — Z880 Allergy status to penicillin: Secondary | ICD-10-CM | POA: Insufficient documentation

## 2014-01-13 DIAGNOSIS — L0501 Pilonidal cyst with abscess: Secondary | ICD-10-CM | POA: Insufficient documentation

## 2014-01-13 DIAGNOSIS — Z8669 Personal history of other diseases of the nervous system and sense organs: Secondary | ICD-10-CM | POA: Insufficient documentation

## 2014-01-13 MED ORDER — HYDROCODONE-ACETAMINOPHEN 5-325 MG PO TABS: 1.0000 | ORAL_TABLET | ORAL | Status: DC | PRN

## 2014-01-13 NOTE — ED Notes (Signed)
Abscess on left buttock x 1.5 weeks

## 2014-01-13 NOTE — ED Provider Notes (Signed)
CSN: [id number]     Arrival date & time 01/13/14  1927 History   First MD Initiated Contact with Patient 01/13/14 1950     Chief Complaint  Patient presents with  . Abscess     (Consider location/radiation/quality/duration/timing/severity/associated sxs/prior Treatment) Patient is a 28 y.o. female presenting with abscess. The history is provided by the patient. No language interpreter was used.  Abscess Location:  Ano-genital Ano-genital abscess location:  Gluteal cleft Abscess quality: draining, painful and redness   Red streaking: no   Duration:  7 days Associated symptoms: no fever and no nausea   Associated symptoms comment:  Recurrent pilonidal abscess starting about 7 days earlier. She felt she had a fever this evening that resolved after getting a bath. No nausea or vomiting. Last abscess at this site was about 1 month ago.   Past Medical History  Diagnosis Date  . Epilepsy    History reviewed. No pertinent past surgical history. History reviewed. No pertinent family history. History  Substance Use Topics  . Smoking status: Never Smoker   . Smokeless tobacco: Not on file  . Alcohol Use: No   OB History   Grav Para Term Preterm Abortions TAB SAB Ect Mult Living   1              Review of Systems  Constitutional: Negative for fever and chills.  Gastrointestinal: Negative for nausea.  Musculoskeletal: Negative.   Skin:       C/O Abscess.  Neurological: Negative.       Allergies  Penicillins  Home Medications   Prior to Admission medications   Medication Sig Start Date End Date Taking? Authorizing Provider  acetaminophen (TYLENOL) 500 MG tablet Take 2 tablets (1,000 mg total) by mouth every 8 (eight) hours as needed. 09/27/13   Dagmar HaitWilliam Blair Walden, MD  clotrimazole (LOTRIMIN) 1 % cream Apply to affected area 2 times daily 06/04/13   Shari A Upstill, PA-C  cyclobenzaprine (FLEXERIL) 10 MG tablet Take 1 tablet (10 mg total) by mouth 2 (two) times daily as  needed for muscle spasms. 09/27/13   Dagmar HaitWilliam Blair Walden, MD   BP 112/97  Temp(Src) 98.2 F (36.8 C) (Oral)  Resp 18  Ht 5\' 3"  (1.6 m)  Wt 237 lb (107.502 kg)  BMI 41.99 kg/m2  SpO2 100%  LMP 07/19/2013 Physical Exam  Constitutional: She is oriented to person, place, and time. She appears well-developed and well-nourished.  Neck: Normal range of motion.  Pulmonary/Chest: Effort normal.  Neurological: She is alert and oriented to person, place, and time.  Skin: Skin is warm and dry.  Large area of swelling to left buttock at pilonidal area with central fluctuance and purulent drainage c/w abscess.    ED Course  Procedures (including critical care time) Labs Review Labs Reviewed - No data to display  Imaging Review No results found.   EKG Interpretation None      MDM   Final diagnoses:  None    1. Pilonidal abscess  INCISION AND DRAINAGE Performed by: Elpidio AnisUPSTILL, SHARI A Consent: Verbal consent obtained. Risks and benefits: risks, benefits and alternatives were discussed Type: abscess  Body area: left buttock, pilonidal area  Anesthesia: local infiltration  Incision was made with a scalpel.  Local anesthetic: lidocaine 2% w/ epinephrine  Anesthetic total: 3 ml  Complexity: complex Blunt dissection to break up loculations  Drainage: purulent  Drainage amount: large, purulent  Packing material: 1/4 in iodoform gauze  Patient tolerance: Patient tolerated the procedure  well with no immediate complications.       Arnoldo HookerShari A Upstill, PA-C 01/13/14 2046

## 2014-01-13 NOTE — Discharge Instructions (Signed)

## 2014-01-13 NOTE — ED Notes (Signed)
Abscess to upper left side of buttocks, unable to sit due to pain, pt reports abscess last month

## 2014-01-13 NOTE — ED Notes (Signed)
Pt is [redacted] weeks pregnant.

## 2014-01-13 NOTE — ED Provider Notes (Signed)
Medical screening examination/treatment/procedure(s) were performed by non-physician practitioner and as supervising physician I was immediately available for consultation/collaboration.   EKG Interpretation None        Melanie Belfi, MD 01/13/14 2118 

## 2014-01-15 ENCOUNTER — Emergency Department (HOSPITAL_BASED_OUTPATIENT_CLINIC_OR_DEPARTMENT_OTHER)
Admission: EM | Admit: 2014-01-15 | Discharge: 2014-01-15 | Disposition: A | Discharge status: Home / Self Care | Payer: Medicaid Other | Attending: Emergency Medicine | Admitting: Emergency Medicine

## 2014-01-15 ENCOUNTER — Encounter (HOSPITAL_BASED_OUTPATIENT_CLINIC_OR_DEPARTMENT_OTHER): Payer: Self-pay | Admitting: Emergency Medicine

## 2014-01-15 DIAGNOSIS — Z4801 Encounter for change or removal of surgical wound dressing: Secondary | ICD-10-CM | POA: Insufficient documentation

## 2014-01-15 DIAGNOSIS — Z09 Encounter for follow-up examination after completed treatment for conditions other than malignant neoplasm: Secondary | ICD-10-CM

## 2014-01-15 DIAGNOSIS — Z8669 Personal history of other diseases of the nervous system and sense organs: Secondary | ICD-10-CM | POA: Insufficient documentation

## 2014-01-15 DIAGNOSIS — Z880 Allergy status to penicillin: Secondary | ICD-10-CM | POA: Insufficient documentation

## 2014-01-15 MED ORDER — CEPHALEXIN 500 MG PO CAPS: 500.0000 mg | ORAL_CAPSULE | Freq: Four times a day (QID) | ORAL | Status: DC

## 2014-01-15 NOTE — ED Notes (Signed)
Patient states she is here for re-check of abscess to left buttock.

## 2014-01-15 NOTE — ED Notes (Signed)
Pt here for recheck of abscess on buttock, reports she has improved 100%

## 2014-01-15 NOTE — Discharge Instructions (Signed)
RETURN HERE AS NEEDED.

## 2014-01-15 NOTE — ED Notes (Signed)
PA at bedside.

## 2014-01-15 NOTE — ED Provider Notes (Signed)
CSN: [id number]     Arrival date & time 01/15/14  2122 History   First MD Initiated Contact with Patient 01/15/14 2215     Chief Complaint  Patient presents with  . Follow-up     (Consider location/radiation/quality/duration/timing/severity/associated sxs/prior Treatment) Patient is a 28 y.o. female presenting with wound check. The history is provided by the patient. No language interpreter was used.  Wound Check Pertinent negatives include no fever. Associated symptoms comments: Abscess drainage 2 days ago, here for recheck. No fever. Packing fell out prior to arrival. She feels the area is significantly better and less painful..    Past Medical History  Diagnosis Date  . Epilepsy    History reviewed. No pertinent past surgical history. History reviewed. No pertinent family history. History  Substance Use Topics  . Smoking status: Never Smoker   . Smokeless tobacco: Not on file  . Alcohol Use: No   OB History   Grav Para Term Preterm Abortions TAB SAB Ect Mult Living   1              Review of Systems  Constitutional: Negative for fever.  Skin: Positive for wound.       See HPI.      Allergies  Penicillins  Home Medications   Prior to Admission medications   Medication Sig Start Date End Date Taking? Authorizing Provider  acetaminophen (TYLENOL) 500 MG tablet Take 2 tablets (1,000 mg total) by mouth every 8 (eight) hours as needed. 09/27/13   Dagmar HaitWilliam Blair Walden, MD  clotrimazole (LOTRIMIN) 1 % cream Apply to affected area 2 times daily 06/04/13   Shari A Upstill, PA-C  cyclobenzaprine (FLEXERIL) 10 MG tablet Take 1 tablet (10 mg total) by mouth 2 (two) times daily as needed for muscle spasms. 09/27/13   Dagmar HaitWilliam Blair Walden, MD  HYDROcodone-acetaminophen (NORCO/VICODIN) 5-325 MG per tablet Take 1 tablet by mouth every 4 (four) hours as needed. 01/13/14   Shari A Upstill, PA-C   BP 130/72  Pulse 93  Temp(Src) 97.9 F (36.6 C) (Oral)  Resp 20  Ht 5' 3.5" (1.613  m)  Wt 237 lb (107.502 kg)  BMI 41.32 kg/m2  SpO2 100%  LMP 07/19/2013 Physical Exam  Skin:  Healing abscess to right buttock. Minimally tender.     ED Course  Procedures (including critical care time) Labs Review Labs Reviewed - No data to display  Imaging Review No results found.   EKG Interpretation None      MDM   Final diagnoses:  None    1. Abscess recheck  Healing abscess that does not require further examination.    Arnoldo HookerShari A Upstill, PA-C 01/15/14 2307

## 2014-01-16 NOTE — ED Provider Notes (Signed)
Medical screening examination/treatment/procedure(s) were performed by non-physician practitioner and as supervising physician I was immediately available for consultation/collaboration.   Robert L Beaton, MD 01/16/14 0658 

## 2014-05-22 ENCOUNTER — Encounter (HOSPITAL_BASED_OUTPATIENT_CLINIC_OR_DEPARTMENT_OTHER): Payer: Self-pay | Admitting: Emergency Medicine

## 2014-12-17 ENCOUNTER — Emergency Department (HOSPITAL_BASED_OUTPATIENT_CLINIC_OR_DEPARTMENT_OTHER)
Admission: EM | Admit: 2014-12-17 | Discharge: 2014-12-17 | Disposition: A | Discharge status: Home / Self Care | Payer: Medicaid Other | Attending: Emergency Medicine | Admitting: Emergency Medicine

## 2014-12-17 ENCOUNTER — Encounter (HOSPITAL_BASED_OUTPATIENT_CLINIC_OR_DEPARTMENT_OTHER): Payer: Self-pay | Admitting: Emergency Medicine

## 2014-12-17 DIAGNOSIS — Z880 Allergy status to penicillin: Secondary | ICD-10-CM | POA: Insufficient documentation

## 2014-12-17 DIAGNOSIS — Z792 Long term (current) use of antibiotics: Secondary | ICD-10-CM | POA: Insufficient documentation

## 2014-12-17 DIAGNOSIS — K029 Dental caries, unspecified: Secondary | ICD-10-CM | POA: Insufficient documentation

## 2014-12-17 DIAGNOSIS — Z8669 Personal history of other diseases of the nervous system and sense organs: Secondary | ICD-10-CM | POA: Insufficient documentation

## 2014-12-17 DIAGNOSIS — K047 Periapical abscess without sinus: Secondary | ICD-10-CM | POA: Diagnosis not present

## 2014-12-17 DIAGNOSIS — K088 Other specified disorders of teeth and supporting structures: Secondary | ICD-10-CM | POA: Diagnosis present

## 2014-12-17 DIAGNOSIS — Z79899 Other long term (current) drug therapy: Secondary | ICD-10-CM | POA: Insufficient documentation

## 2014-12-17 MED ORDER — CLINDAMYCIN HCL 150 MG PO CAPS
150.0000 mg | ORAL_CAPSULE | Freq: Once | ORAL | Status: AC
  Administered 2014-12-17: 150 mg via ORAL
  Filled 2014-12-17: qty 1

## 2014-12-17 MED ORDER — CLINDAMYCIN HCL 150 MG PO CAPS: 150.0000 mg | ORAL_CAPSULE | Freq: Four times a day (QID) | ORAL | Status: DC

## 2014-12-17 NOTE — ED Notes (Signed)
Pt states was seen by dentist last month and told she had an abcess on her upper right gum line and would need tooth pulled.  Pt did not get tooth pulled, but noticed last night her right upper cheek swelling.  Pt denies any fever and states intermittent pain in upper right cheek and gum.

## 2014-12-17 NOTE — ED Provider Notes (Signed)
CSN: [id number]     Arrival date & time 12/17/14  0941 History   First MD Initiated Contact with Patient 12/17/14 1007     Chief Complaint  Patient presents with  . Facial Swelling  . Dental Pain     (Consider location/radiation/quality/duration/timing/severity/associated sxs/prior Treatment) Patient is a 29 y.o. female presenting with tooth pain. The history is provided by the patient.  Dental Pain Location:  Upper Upper teeth location:  4/RU 2nd bicuspid Quality:  Aching and localized Severity:  Moderate Onset quality:  Gradual Duration:  2 days Timing:  Constant Progression:  Worsening Chronicity:  New Context: abscess and dental caries   Relieved by:  Nothing Worsened by:  Nothing tried Ineffective treatments:  Ice Associated symptoms: facial pain, facial swelling and gum swelling   Associated symptoms: no congestion, no difficulty swallowing, no drooling and no trismus   Risk factors: periodontal disease     Past Medical History  Diagnosis Date  . Epilepsy    History reviewed. No pertinent past surgical history. No family history on file. History  Substance Use Topics  . Smoking status: Never Smoker   . Smokeless tobacco: Not on file  . Alcohol Use: No   OB History    Gravida Para Term Preterm AB TAB SAB Ectopic Multiple Living   1              Review of Systems  HENT: Positive for facial swelling. Negative for congestion and drooling.   All other systems reviewed and are negative.     Allergies  Penicillins  Home Medications   Prior to Admission medications   Medication Sig Start Date End Date Taking? Authorizing Provider  acetaminophen (TYLENOL) 500 MG tablet Take 2 tablets (1,000 mg total) by mouth every 8 (eight) hours as needed. 09/27/13   Elwin MochaBlair Walden, MD  cephALEXin (KEFLEX) 500 MG capsule Take 1 capsule (500 mg total) by mouth 4 (four) times daily. 01/15/14   Elpidio AnisShari Upstill, PA-C  clindamycin (CLEOCIN) 150 MG capsule Take 1 capsule (150 mg  total) by mouth every 6 (six) hours. 12/17/14   Gwyneth SproutWhitney Dickey Caamano, MD  clotrimazole (LOTRIMIN) 1 % cream Apply to affected area 2 times daily 06/04/13   Elpidio AnisShari Upstill, PA-C  cyclobenzaprine (FLEXERIL) 10 MG tablet Take 1 tablet (10 mg total) by mouth 2 (two) times daily as needed for muscle spasms. 09/27/13   Elwin MochaBlair Walden, MD  HYDROcodone-acetaminophen (NORCO/VICODIN) 5-325 MG per tablet Take 1 tablet by mouth every 4 (four) hours as needed. 01/13/14   Elpidio AnisShari Upstill, PA-C   BP 96/75 mmHg  Pulse 85  Temp(Src) 98.1 F (36.7 C) (Oral)  Resp 18  Ht 5\' 3"  (1.6 m)  Wt 219 lb (99.338 kg)  BMI 38.80 kg/m2  SpO2 99%  Breastfeeding? Unknown Physical Exam  Constitutional: She is oriented to person, place, and time. She appears well-developed and well-nourished. No distress.  HENT:  Head: Normocephalic and atraumatic.  Mouth/Throat: No trismus in the jaw. Dental abscesses and dental caries present.    Eyes: EOM are normal. Pupils are equal, round, and reactive to light.  Cardiovascular: Normal rate.   Pulmonary/Chest: Effort normal.  Lymphadenopathy:    She has no cervical adenopathy.  Neurological: She is alert and oriented to person, place, and time.  Skin: Skin is dry. No rash noted. No erythema.  Psychiatric: She has a normal mood and affect. Her behavior is normal.  Nursing note and vitals reviewed.   ED Course  Procedures (including critical care time)  Labs Review Labs Reviewed - No data to display  Imaging Review No results found.   EKG Interpretation None      MDM   Final diagnoses:  Dental abscess    Pt with dental caries and facial swelling.  No signs of ludwig's angina or difficulty swallowing and no systemic symptoms. Will treat with clinda and have pt f/u with dentist.     Gwyneth Sprout, MD 12/17/14 1501

## 2014-12-17 NOTE — Discharge Instructions (Signed)

## 2016-07-15 ENCOUNTER — Emergency Department (HOSPITAL_COMMUNITY)
Admission: EM | Admit: 2016-07-15 | Discharge: 2016-07-15 | Disposition: A | Discharge status: Home / Self Care | Payer: Medicaid Other | Attending: Emergency Medicine | Admitting: Emergency Medicine

## 2016-07-15 ENCOUNTER — Encounter (HOSPITAL_COMMUNITY): Payer: Self-pay | Admitting: Emergency Medicine

## 2016-07-15 DIAGNOSIS — K047 Periapical abscess without sinus: Secondary | ICD-10-CM

## 2016-07-15 DIAGNOSIS — R51 Headache: Secondary | ICD-10-CM | POA: Diagnosis present

## 2016-07-15 MED ORDER — IBUPROFEN 800 MG PO TABS
800.0000 mg | ORAL_TABLET | Freq: Once | ORAL | Status: AC
  Administered 2016-07-15: 800 mg via ORAL
  Filled 2016-07-15: qty 1

## 2016-07-15 MED ORDER — IBUPROFEN 800 MG PO TABS: 800.0000 mg | ORAL_TABLET | Freq: Three times a day (TID) | ORAL | 0 refills | Status: DC

## 2016-07-15 MED ORDER — CLINDAMYCIN HCL 150 MG PO CAPS: 150.0000 mg | ORAL_CAPSULE | Freq: Four times a day (QID) | ORAL | 0 refills | Status: DC

## 2016-07-15 MED ORDER — CLINDAMYCIN HCL 300 MG PO CAPS
300.0000 mg | ORAL_CAPSULE | Freq: Once | ORAL | Status: AC
  Administered 2016-07-15: 300 mg via ORAL
  Filled 2016-07-15: qty 1

## 2016-07-15 MED FILL — CLINDAMYCIN HCL 150 MG CAPS: 150 | 7 days supply | Qty: 28 | Fill #0

## 2016-07-15 MED FILL — IBUPROFEN 800 MG TABLET: 800 | 7 days supply | Qty: 21 | Fill #0

## 2016-07-15 NOTE — ED Triage Notes (Signed)
Pt c/o right facial edema onset today at 0000, took ibuprofen, edema has progressed. Right midface edema. Recent right upper molar fracture. No SOB but states right nostril is difficult to breath through, resolved with opening mouth to breath.

## 2016-07-15 NOTE — ED Provider Notes (Signed)
[facility]-EMERGENCY DEPT Provider Note   CSN: [id number] Arrival date & time: 07/15/16  [id number]     History   Chief Complaint Chief Complaint  Patient presents with  . Facial Pain    HPI [name] is a 30 y.o. female.  HPI Patient has had recurrent problems with her right upper first molar. She reports that last yesterday and started to hurt. She tried ibuprofen but reports this morning it was very swollen and her face. She reports is tender to pressure over the tooth. She has not noted any drainage from it. No associated fever or chills. Past Medical History:  Diagnosis Date  . Epilepsy (HCC)     There are no active problems to display for this patient.   History reviewed. No pertinent surgical history.  OB History    Gravida Para Term Preterm AB Living   1             SAB TAB Ectopic Multiple Live Births                   Home Medications    Prior to Admission medications   Medication Sig Start Date End Date Taking? Authorizing Provider  acetaminophen (TYLENOL) 500 MG tablet Take 2 tablets (1,000 mg total) by mouth every 8 (eight) hours as needed. 09/27/13  Yes Elwin MochaBlair Walden, MD  ibuprofen (ADVIL,MOTRIN) 200 MG tablet Take 200-400 mg by mouth every 6 (six) hours as needed for mild pain.   Yes Historical Provider, MD  cephALEXin (KEFLEX) 500 MG capsule Take 1 capsule (500 mg total) by mouth 4 (four) times daily. Patient not taking: Reported on 07/15/2016 01/15/14   Elpidio AnisShari Upstill, PA-C  clindamycin (CLEOCIN) 150 MG capsule Take 1 capsule (150 mg total) by mouth every 6 (six) hours. Patient not taking: Reported on 07/15/2016 12/17/14   Gwyneth SproutWhitney Plunkett, MD  clotrimazole (LOTRIMIN) 1 % cream Apply to affected area 2 times daily Patient not taking: Reported on 07/15/2016 06/04/13   Elpidio AnisShari Upstill, PA-C  cyclobenzaprine (FLEXERIL) 10 MG tablet Take 1 tablet (10 mg total) by mouth 2 (two) times daily as needed for muscle spasms. Patient not taking: Reported on 07/15/2016  09/27/13   Elwin MochaBlair Walden, MD  HYDROcodone-acetaminophen (NORCO/VICODIN) 5-325 MG per tablet Take 1 tablet by mouth every 4 (four) hours as needed. Patient not taking: Reported on 07/15/2016 01/13/14   Elpidio AnisShari Upstill, PA-C    Family History History reviewed. No pertinent family history.  Social History Social History  Substance Use Topics  . Smoking status: Never Smoker  . Smokeless tobacco: Not on file  . Alcohol use No     Allergies   Penicillins   Review of Systems Review of Systems Constitutional: No fever no chills ENT: No nasal drainage or difficulty swallowing  Physical Exam Updated Vital Signs BP 132/88   Pulse 71   Temp 98.4 F (36.9 C) (Oral)   Resp 16   SpO2 100%   Physical Exam  Constitutional: She appears well-developed and well-nourished. No distress.  HENT:  Mouth/Throat:    Moderate right facial swelling over the nasolabial region. Mildly tender. Oral cavity, first upper right molar decayed to the gumline. No drainage or discharge around the gum. Otherwise posterior oropharynx widely patent said erythema or exudate. No trismus  Eyes: EOM are normal. Pupils are equal, round, and reactive to light.  Neck: Neck supple.  No meningismus neck is supple  Pulmonary/Chest: Effort normal.  Lymphadenopathy:    She has no cervical adenopathy.  Skin:  Skin is warm and dry.  Psychiatric: She has a normal mood and affect.     ED Treatments / Results  Labs (all labs ordered are listed, but only abnormal results are displayed) Labs Reviewed - No data to display  EKG  EKG Interpretation None       Radiology No results found.  Procedures Procedures (including critical care time)  Medications Ordered in ED Medications  clindamycin (CLEOCIN) capsule 300 mg (not administered)  ibuprofen (ADVIL,MOTRIN) tablet 800 mg (not administered)     Initial Impression / Assessment and Plan / ED Course  I have reviewed the triage vital signs and the nursing  notes.  Pertinent labs & imaging results that were available during my care of the patient were reviewed by me and considered in my medical decision making (see chart for details).  Clinical Course     Final Clinical Impressions(s) / ED Diagnoses   Final diagnoses:  Apical abscess   Patient presents history of similar problem. At this time, patient has moderate facial swelling but no trismus, meningismus or cervical adenopathy. She will be started on clindamycin and continue ibuprofen for pain. Patient's pain is predominantly his pressure directly over the tooth was only mild to moderate discomfort to palpation over the facial swelling. New Prescriptions New Prescriptions   No medications on file     Arby BarretteMarcy Furman Trentman, MD 07/15/16 [phone]

## 2017-06-11 ENCOUNTER — Encounter (HOSPITAL_COMMUNITY): Payer: Self-pay | Admitting: *Deleted

## 2017-06-11 ENCOUNTER — Other Ambulatory Visit: Payer: Self-pay

## 2017-06-11 ENCOUNTER — Emergency Department (HOSPITAL_COMMUNITY)
Admission: EM | Admit: 2017-06-11 | Discharge: 2017-06-11 | Disposition: A | Discharge status: Home / Self Care | Payer: Medicaid Other | Attending: Emergency Medicine | Admitting: Emergency Medicine

## 2017-06-11 DIAGNOSIS — J029 Acute pharyngitis, unspecified: Secondary | ICD-10-CM | POA: Diagnosis not present

## 2017-06-11 MED ORDER — AZITHROMYCIN 250 MG PO TABS: 250.0000 mg | ORAL_TABLET | Freq: Every day | ORAL | 0 refills | Status: DC

## 2017-06-11 MED ORDER — IBUPROFEN 400 MG PO TABS
600.0000 mg | ORAL_TABLET | Freq: Once | ORAL | Status: AC
  Administered 2017-06-11: 600 mg via ORAL
  Filled 2017-06-11: qty 1

## 2017-06-11 NOTE — ED Triage Notes (Signed)
To ED for eval of sore throat and feeling shaky since yesterday. Sounds congested.

## 2017-06-11 NOTE — Discharge Instructions (Signed)
Please take all of your antibiotics until finished!   You may develop abdominal discomfort or diarrhea from the antibiotic.  You may help offset this with probiotics which you can buy or get in yogurt. Do not eat  or take the probiotics until 2 hours after your antibiotic.   You will take 2 tablets today and then 1 tablet on days 2 through 5 until completed.  Alternate 600 mg of ibuprofen and [phone] mg of Tylenol every 3 hours as needed for pain and fever. Do not exceed 4000 mg of Tylenol daily.  Drink plenty of fluids and get plenty of rest.  Use over-the-counter medicines such as throat lozenges, warm teas and soups for relief of your sore throat.  Follow-up with a primary care physician if your symptoms persist.  Return to the ED immediately for any concerning signs or symptoms develop such as chest pain, difficulty breathing, drooling, or significant throat tightness.

## 2017-06-11 NOTE — ED Provider Notes (Signed)
[facility] Provider Note   CSN: [id number] Arrival date & time: 06/11/17  1118     History   Chief Complaint Chief Complaint  Patient presents with  . Sore Throat    HPI Shannon Velasquez is a 31 y.o. female who presents today with chief complaint acute onset, progressively worsening sore throat which began yesterday.  Associated symptoms include fever, chills, generalized myalgias, and nasal congestion.  Pain is sharp with swallowing.  She denies shortness of breath, chest pain, drooling, or throat tightness.  She is able to tolerate food and fluids although she endorses decreased appetite.  Her daughter has similar symptoms with low-grade fever at home.  She has tried NyQuil which has been mildly helpful.  The history is provided by the patient.    Past Medical History:  Diagnosis Date  . Epilepsy (HCC)     There are no active problems to display for this patient.   History reviewed. No pertinent surgical history.  OB History    Gravida Para Term Preterm AB Living   1             SAB TAB Ectopic Multiple Live Births                   Home Medications    Prior to Admission medications   Medication Sig Start Date End Date Taking? Authorizing Provider  acetaminophen (TYLENOL) 500 MG tablet Take 2 tablets (1,000 mg total) by mouth every 8 (eight) hours as needed. 09/27/13  Yes Elwin MochaWalden, Blair, MD  ibuprofen (ADVIL,MOTRIN) 200 MG tablet Take 200-400 mg by mouth every 6 (six) hours as needed for mild pain.   Yes [provider]  azithromycin (ZITHROMAX) 250 MG tablet Take 1 tablet (250 mg total) by mouth daily. Take first 2 tablets together, then 1 every day until finished. 06/11/17   Luevenia MaxinFawze, Merik Mignano A, PA-C  clindamycin (CLEOCIN) 150 MG capsule Take 1 capsule (150 mg total) by mouth every 6 (six) hours. Patient not taking: Reported on 06/11/2017 07/15/16   Arby BarrettePfeiffer, Marcy, MD  clotrimazole (LOTRIMIN) 1 % cream Apply to affected  area 2 times daily Patient not taking: Reported on 07/15/2016 06/04/13   Elpidio AnisUpstill, Shari, PA-C  cyclobenzaprine (FLEXERIL) 10 MG tablet Take 1 tablet (10 mg total) by mouth 2 (two) times daily as needed for muscle spasms. Patient not taking: Reported on 07/15/2016 09/27/13   Elwin MochaWalden, Blair, MD  HYDROcodone-acetaminophen (NORCO/VICODIN) 5-325 MG per tablet Take 1 tablet by mouth every 4 (four) hours as needed. Patient not taking: Reported on 07/15/2016 01/13/14   Elpidio AnisUpstill, Shari, PA-C  ibuprofen (ADVIL,MOTRIN) 800 MG tablet Take 1 tablet (800 mg total) by mouth 3 (three) times daily. Patient not taking: Reported on 06/11/2017 07/15/16   Arby BarrettePfeiffer, Marcy, MD    Family History No family history on file.  Social History Social History   Tobacco Use  . Smoking status: Never Smoker  Substance Use Topics  . Alcohol use: No  . Drug use: No     Allergies   Penicillins   Review of Systems Review of Systems  Constitutional: Positive for chills and fever.  HENT: Positive for congestion and sore throat.   Respiratory: Negative for shortness of breath.   Cardiovascular: Negative for chest pain.  Gastrointestinal: Negative for abdominal pain, nausea and vomiting.     Physical Exam Updated Vital Signs BP 111/76   Pulse (!) 113   Temp (!) 102.1 F (38.9 C) (Oral)   Resp  16   SpO2 100%   Physical Exam  Constitutional: She appears well-developed and well-nourished. No distress.  HENT:  Head: Normocephalic and atraumatic.  Right Ear: Tympanic membrane and ear canal normal.  Left Ear: Tympanic membrane and ear canal normal.  Mouth/Throat: Uvula is midline. No tonsillar abscesses. Tonsils are 3+ on the right. Tonsils are 3+ on the left. Tonsillar exudate.  No frontal or maxillary sinus TTP.  Nasal congestion present bilaterally, nasal septum midline with pink mucosa.  Posterior oropharynx with tonsillar hypertrophy, erythema, and exudates, no uvular deviation, trismus, or sublingual  abnormalities.  Eyes: Conjunctivae are normal. Right eye exhibits no discharge. Left eye exhibits no discharge.  Neck: No JVD present. No tracheal deviation present.  Bilateral anterior cervical lymphadenopathy  Cardiovascular: Normal heart sounds.  Tachycardic  Pulmonary/Chest: Effort normal and breath sounds normal.  Abdominal: Soft. She exhibits no distension.  Musculoskeletal: She exhibits no edema.  Lymphadenopathy:    She has cervical adenopathy.  Neurological: She is alert.  Skin: Skin is warm and dry. No erythema.  Psychiatric: She has a normal mood and affect. Her behavior is normal.  Nursing note and vitals reviewed.    ED Treatments / Results  Labs (all labs ordered are listed, but only abnormal results are displayed) Labs Reviewed - No data to display  EKG  EKG Interpretation None       Radiology No results found.  Procedures Procedures (including critical care time)  Medications Ordered in ED Medications  ibuprofen (ADVIL,MOTRIN) tablet 600 mg (600 mg Oral Given 06/11/17 1149)     Initial Impression / Assessment and Plan / ED Course  I have reviewed the triage vital signs and the nursing notes.  Pertinent labs & imaging results that were available during my care of the patient were reviewed by me and considered in my medical decision making (see chart for details).     Pt febrile with tonsillar exudate, cervical lymphadenopathy, & dysphagia; diagnosis of strep. Treated in the Ed with NSAIDs.  She has penicillin allergy will so will treat with Z-Pak.  Pt appears mildly dehydrated, discussed importance of water rehydration. Presentation non concerning for PTA or infxn spread to soft tissue. No trismus or uvula deviation. Specific return precautions discussed. Pt able to drink water in ED without difficulty with intact air way. Recommended PCP follow up. Pt verbalized understanding of and agreement with plan and is safe for discharge home at this  time.   Final Clinical Impressions(s) / ED Diagnoses   Final diagnoses:  Sore throat    ED Discharge Orders        Ordered    azithromycin (ZITHROMAX) 250 MG tablet  Daily     06/11/17 1146       Jeanie SewerFawze, Hernan Turnage A, PA-C 06/11/17 1152    Cathren LaineSteinl, Kevin, MD 06/11/17 1234

## 2017-07-06 ENCOUNTER — Emergency Department (HOSPITAL_BASED_OUTPATIENT_CLINIC_OR_DEPARTMENT_OTHER)
Admission: EM | Admit: 2017-07-06 | Discharge: 2017-07-06 | Disposition: A | Discharge status: Home / Self Care | Payer: Medicaid Other | Attending: Emergency Medicine | Admitting: Emergency Medicine

## 2017-07-06 ENCOUNTER — Encounter (HOSPITAL_BASED_OUTPATIENT_CLINIC_OR_DEPARTMENT_OTHER): Payer: Self-pay | Admitting: Emergency Medicine

## 2017-07-06 ENCOUNTER — Other Ambulatory Visit: Payer: Self-pay

## 2017-07-06 DIAGNOSIS — Z791 Long term (current) use of non-steroidal anti-inflammatories (NSAID): Secondary | ICD-10-CM | POA: Insufficient documentation

## 2017-07-06 DIAGNOSIS — Z79899 Other long term (current) drug therapy: Secondary | ICD-10-CM | POA: Diagnosis not present

## 2017-07-06 DIAGNOSIS — L02412 Cutaneous abscess of left axilla: Secondary | ICD-10-CM | POA: Diagnosis present

## 2017-07-06 MED ORDER — SULFAMETHOXAZOLE-TRIMETHOPRIM 800-160 MG PO TABS: 1.0000 | ORAL_TABLET | Freq: Two times a day (BID) | ORAL | 0 refills | Status: DC

## 2017-07-06 MED ORDER — LIDOCAINE-EPINEPHRINE 1 %-1:100000 IJ SOLN
INTRAMUSCULAR | Status: AC
  Administered 2017-07-06: 1 mL
  Filled 2017-07-06: qty 1

## 2017-07-06 MED ORDER — ACETAMINOPHEN 325 MG PO TABS
650.0000 mg | ORAL_TABLET | Freq: Once | ORAL | Status: AC
  Administered 2017-07-06: 650 mg via ORAL
  Filled 2017-07-06: qty 2

## 2017-07-06 NOTE — ED Provider Notes (Signed)
[facility] EMERGENCY DEPARTMENT Provider Note   CSN: [id number] Arrival date & time: 07/06/17  1700     History   Chief Complaint Chief Complaint  Patient presents with  . Abscess    HPI Shannon Velasquez is a 31 y.o. female.  HPI  Ms. Milhoan is a 31 year old female with a history of recurrent axillary abscesses and epilepsy who presents to the emergency department for evaluation of left axillary abscess.  Patient states that abscess developed about a week and a half ago and has gradually been getting bigger, more tender and red.  Pain is 8/10 in severity, constant and "throbbing" in nature.  She denies fever, nausea/vomiting, abdominal pain, wound or abscess elsewhere.  She denies IV drug use.  Past Medical History:  Diagnosis Date  . Epilepsy (HCC)     There are no active problems to display for this patient.   History reviewed. No pertinent surgical history.  OB History    Gravida Para Term Preterm AB Living   1             SAB TAB Ectopic Multiple Live Births                   Home Medications    Prior to Admission medications   Medication Sig Start Date End Date Taking? Authorizing Provider  acetaminophen (TYLENOL) 500 MG tablet Take 2 tablets (1,000 mg total) by mouth every 8 (eight) hours as needed. 09/27/13   Elwin Mocha, MD  azithromycin (ZITHROMAX) 250 MG tablet Take 1 tablet (250 mg total) by mouth daily. Take first 2 tablets together, then 1 every day until finished. 06/11/17   Luevenia Maxin, Mina A, PA-C  clindamycin (CLEOCIN) 150 MG capsule Take 1 capsule (150 mg total) by mouth every 6 (six) hours. Patient not taking: Reported on 06/11/2017 07/15/16   Arby Barrette, MD  clotrimazole (LOTRIMIN) 1 % cream Apply to affected area 2 times daily Patient not taking: Reported on 07/15/2016 06/04/13   Elpidio Anis, PA-C  cyclobenzaprine (FLEXERIL) 10 MG tablet Take 1 tablet (10 mg total) by mouth 2 (two) times daily as needed for muscle spasms. Patient  not taking: Reported on 07/15/2016 09/27/13   Elwin Mocha, MD  HYDROcodone-acetaminophen (NORCO/VICODIN) 5-325 MG per tablet Take 1 tablet by mouth every 4 (four) hours as needed. Patient not taking: Reported on 07/15/2016 01/13/14   Elpidio Anis, PA-C  ibuprofen (ADVIL,MOTRIN) 200 MG tablet Take 200-400 mg by mouth every 6 (six) hours as needed for mild pain.    [provider]  ibuprofen (ADVIL,MOTRIN) 800 MG tablet Take 1 tablet (800 mg total) by mouth 3 (three) times daily. Patient not taking: Reported on 06/11/2017 07/15/16   Arby Barrette, MD    Family History History reviewed. No pertinent family history.  Social History Social History   Tobacco Use  . Smoking status: Never Smoker  . Smokeless tobacco: Never Used  Substance Use Topics  . Alcohol use: No  . Drug use: No     Allergies   Penicillins   Review of Systems Review of Systems  Constitutional: Negative for chills, fatigue and fever.  Gastrointestinal: Negative for abdominal pain, nausea and vomiting.  Musculoskeletal: Negative for gait problem.  Skin: Positive for color change (erythematous abscess).     Physical Exam Updated Vital Signs BP 102/73 (BP Location: Right Arm)   Pulse 75   Temp 98.5 F (36.9 C) (Oral)   Resp 16   Ht 5\' 3"  (1.6 m)  Wt 90.7 kg (200 lb)   LMP 07/03/2017   SpO2 100%   BMI 35.43 kg/m   Physical Exam  Constitutional: She is oriented to person, place, and time. She appears well-developed and well-nourished. No distress.  HENT:  Head: Normocephalic and atraumatic.  Mouth/Throat: Oropharynx is clear and moist. No oropharyngeal exudate.  Eyes: Conjunctivae are normal. Pupils are equal, round, and reactive to light. Right eye exhibits no discharge. Left eye exhibits no discharge. No scleral icterus.  Neck: Normal range of motion. Neck supple.  Cardiovascular: Normal rate, regular rhythm and intact distal pulses. Exam reveals no friction rub.  No murmur  heard. Pulmonary/Chest: Effort normal and breath sounds normal. No stridor. No respiratory distress. She has no wheezes. She has no rales.  Abdominal: Soft. Bowel sounds are normal. There is no tenderness. There is no guarding.  Musculoskeletal: Normal range of motion.  Neurological: She is alert and oriented to person, place, and time. Coordination normal.  Skin: Skin is warm and dry. Capillary refill takes less than 2 seconds. She is not diaphoretic.  Approximately 3 cm raised round, erythematous area in the left axilla.  It is warm to the touch and tender.  Mild surrounding induration.  No active drainage.  Psychiatric: She has a normal mood and affect. Her behavior is normal.  Nursing note and vitals reviewed.    ED Treatments / Results  Labs (all labs ordered are listed, but only abnormal results are displayed) Labs Reviewed - No data to display  EKG  EKG Interpretation None       Radiology No results found.  Procedures .Marland Kitchen.Incision and Drainage Date/Time: 07/06/2017 7:53 PM Performed by: Kellie ShropshireShrosbree, Emily J, PA-C Authorized by: Kellie ShropshireShrosbree, Emily J, PA-C   Consent:    Consent obtained:  Verbal and emergent situation   Consent given by:  Patient   Risks discussed:  Bleeding, incomplete drainage, infection and pain   Alternatives discussed:  No treatment and delayed treatment Location:    Type:  Abscess   Size:  3cm   Location: left axilla. Pre-procedure details:    Skin preparation:  Betadine Anesthesia (see MAR for exact dosages):    Anesthesia method:  Local infiltration   Local anesthetic:  Lidocaine 1% WITH epi Procedure type:    Complexity:  Simple Procedure details:    Incision types:  Single straight   Scalpel blade:  11   Wound management:  Probed and deloculated, irrigated with saline and extensive cleaning   Drainage:  Purulent   Drainage amount:  Copious   Wound treatment:  Wound left open   Packing materials:  None Post-procedure details:     Patient tolerance of procedure:  Tolerated well, no immediate complications   (including critical care time)  Medications Ordered in ED Medications  lidocaine-EPINEPHrine (XYLOCAINE W/EPI) 1 %-1:100000 (with pres) injection (not administered)  acetaminophen (TYLENOL) tablet 650 mg (650 mg Oral Given 07/06/17 1713)     Initial Impression / Assessment and Plan / ED Course  I have reviewed the triage vital signs and the nursing notes.  Pertinent labs & imaging results that were available during my care of the patient were reviewed by me and considered in my medical decision making (see chart for details).     Patient with skin abscess amenable to incision and drainage.  Abscess was not large enough to warrant packing or drain,  wound recheck in 2 days. Encouraged home warm soaks and flushing.  Mild signs of cellulitis is surrounding skin.  Will  d/c with Bactrim.  Discussed return precautions and patient agrees and voices understanding. Patient afebrile, nontoxic appearing and in no acute distress. She has no complaints prior to discharge.   Final Clinical Impressions(s) / ED Diagnoses   Final diagnoses:  Abscess of left axilla    ED Discharge Orders        Ordered    sulfamethoxazole-trimethoprim (BACTRIM DS,SEPTRA DS) 800-160 MG tablet  2 times daily     07/06/17 1954       Lawrence MarseillesShrosbree, Emily J, PA-C 07/06/17 1956    Charlynne PanderYao, David Hsienta, MD 07/07/17 [phone]

## 2017-07-06 NOTE — ED Triage Notes (Addendum)
Patient reports abscess to left axilla x 1-2 weeks.  States this has not opened and drainage on its own.  Patient febrile in the ED.  Reports unknown if she had fevers at home.

## 2017-07-06 NOTE — Discharge Instructions (Signed)
Please fill prescription for antibiotic and take twice a day for the next 7 days.  Please take antibiotic until finished, even if your pain and redness goes away prior.  Return to the emergency department or urgent care in 2 days to have a wound recheck.  Please gently wash with warm soapy water daily starting tomorrow.  Return to the emergency department if you develop a fever greater than 100.4 F, worsening redness or swelling in the armpit or have any new or worsening symptoms.

## 2017-07-24 ENCOUNTER — Ambulatory Visit: Payer: Self-pay | Admitting: Surgery

## 2017-07-24 NOTE — H&P (Signed)
Shannon Velasquez Documented: 07/24/2017 2:19 PM Location: [facility] Patient #: [id number] DOB: 05/09/1986 Single / Language: [place] / Race: Black or African American Female  History of Present Illness (Loralyn Rachel A. Fredricka Bonineonnor MD; 07/24/2017 2:45 PM) Patient words: This is a very pleasant and otherwise healthy 32 year old woman with bilateral axillary hidradenitis. She's had recurrent boils for several years, essentially since she was a teenager. She notes that she occasionally will get boils on her backside as well but only when she is pregnant. She has had multiple local Andy's performed in the emergency department. She's never been on any topical medications. No prior surgical intervention.  She works at [employer] as a Conservation officer, naturecashier.  The patient is a 32 year old female.   Allergies (Tanisha A. Manson PasseyBrown, RMA; 07/24/2017 2:21 PM) Penicillins Hives. Allergies Reconciled  Medication History (Tanisha A. Manson PasseyBrown, RMA; 07/24/2017 2:21 PM) No Current Medications Medications Reconciled     Review of Systems (Yehoshua Vitelli A. Fredricka Bonineonnor MD; 07/24/2017 2:45 PM) All other systems negative  Vitals (Tanisha A. Brown RMA; 07/24/2017 2:20 PM) 07/24/2017 2:20 PM Weight: 213.5 lb Height: 63in Body Surface Area: 1.99 m Body Mass Index: 37.82 kg/m  Temp.: 98.61F  Pulse: 97 (Regular)  BP: 122/84 (Sitting, Left Arm, Standard)      Physical Exam (Mercia Dowe A. Ahyana Skillin MD; 07/24/2017 2:46 PM)  General Note: alert and well appearing  Integumentary Note: warm and dry In bilateral axilla she has chronic scarring, abscess and sinus tracts consistent with late stage hidradenitis.  Head and Neck Note: no mass or thyromegaly  Eye Note: No scleral icterus. Extra ocular motions intact.  ENMT Note: Moist mucous membranes, dentition intact  Chest and Lung Exam Note: Unlabored respirations, clear bilaterally  Cardiovascular Note: Regular rate and rhythm, no pedal edema  Abdomen Note: Soft,  nontender nondistended. No mass or organomegaly.  Neurologic Note: Grossly intact, normal gait  Neuropsychiatric Note: Normal mood and affect. Appropriate insight.  Musculoskeletal Note: Strength symmetrical throughout, no deformity    Assessment & Plan (Crysten Kaman A. Fredricka Bonineonnor MD; 07/24/2017 2:48 PM)  HIDRADENITIS AXILLARIS (L73.2) Story: We'll plan excision starting with the right side. We discussed options of no surgery and trial of topical clindamycin, versus ongoing observation, versus total excision. We discussed that in the setting of total excision, either primary closure or rotational flap closure would be attempted and discussed essentially 100% risk of wound problems including the possibility of an open wound right after surgery which would have to heal from inside out. We discussed risks of bleeding, infection, pain, scarring. She had several insightful questions WERE answered. She would like to go forward with surgery. We will start with the right side and get her scheduled in the coming weeks.

## 2017-08-12 NOTE — Patient Instructions (Signed)
Your procedure is scheduled on: Wednesday, Jan. 30, 2019   Surgery Time:  10:00AM-12:30PM   [facility]    Report to admitting at 8:00 AM   Call this number if you have problems the morning of surgery [phone]   Do not eat food or drink liquids :After Midnight.   Do NOT smoke after Midnight   Take these medicines the morning of surgery with A SIP OF WATER: None                               You may not have any metal on your body including hair pins, jewelry, and body piercings             Do not wear make-up, lotions, powders, perfumes/cologne, or deodorant             Do not wear nail polish.  Do not shave  48 hours prior to surgery.              Men may shave face and neck.   Do not bring valuables to the hospital.  IS NOT             RESPONSIBLE   FOR VALUABLES.   Contacts, dentures or bridgework may not be worn into surgery.    Patients discharged the day of surgery will not be allowed to drive home.   Name and phone number of your driver:   Special Instructions: Bring a copy of your healthcare power of attorney and living will documents         the day of surgery if you haven't scanned them in before.              Please read over the following fact sheets you were given:   [facility] - Preparing for [facility] Before surgery, you can play an important role.  Because skin is not sterile, your skin needs to be as free of germs as possible.  You can reduce the number of germs on your skin by washing with CHG (chlorahexidine gluconate) soap before surgery.  CHG is an antiseptic cleaner which kills germs and bonds with the skin to continue killing germs even after washing. Please DO NOT use if you have an allergy to CHG or antibacterial soaps.  If your skin becomes reddened/irritated stop using the CHG and inform your nurse when you arrive at Short Stay. Do not shave (including legs and underarms) for at least 48 hours prior  to the first CHG shower.  You may shave your face/neck.  Please follow these instructions carefully:  1.  Shower with CHG Soap the night before surgery and the  morning of surgery.  2.  If you choose to wash your hair, wash your hair first as usual with your normal  shampoo.  3.  After you shampoo, rinse your hair and body thoroughly to remove the shampoo.                             4.  Use CHG as you would any other liquid soap.  You can apply chg directly to the skin and wash.  Gently with a scrungie or clean washcloth.  5.  Apply the CHG Soap to your body ONLY FROM THE NECK DOWN.   Do   not use on face/ open  Wound or open sores. Avoid contact with eyes, ears mouth and   genitals (private parts).                       Wash face,  Genitals (private parts) with your normal soap.             6.  Wash thoroughly, paying special attention to the area where your    surgery  will be performed.  7.  Thoroughly rinse your body with warm water from the neck down.  8.  DO NOT shower/wash with your normal soap after using and rinsing off the CHG Soap.                9.  Pat yourself dry with a clean towel.            10.  Wear clean pajamas.            11.  Place clean sheets on your bed the night of your first shower and do not  sleep with pets. Day of Surgery : Do not apply any lotions/deodorants the morning of surgery.  Please wear clean clothes to the hospital/surgery center.  FAILURE TO FOLLOW THESE INSTRUCTIONS MAY RESULT IN THE CANCELLATION OF YOUR SURGERY  PATIENT SIGNATURE_________________________________  NURSE SIGNATURE__________________________________  ________________________________________________________________________

## 2017-08-14 ENCOUNTER — Other Ambulatory Visit: Payer: Self-pay

## 2017-08-14 ENCOUNTER — Encounter (HOSPITAL_COMMUNITY)
Admission: RE | Admit: 2017-08-14 | Discharge: 2017-08-14 | Disposition: A | Discharge status: Home / Self Care | Payer: Medicaid Other | Source: Ambulatory Visit | Attending: Surgery | Admitting: Surgery

## 2017-08-14 ENCOUNTER — Encounter (HOSPITAL_COMMUNITY): Payer: Self-pay

## 2017-08-14 DIAGNOSIS — Z01812 Encounter for preprocedural laboratory examination: Secondary | ICD-10-CM | POA: Insufficient documentation

## 2017-08-14 DIAGNOSIS — L732 Hidradenitis suppurativa: Secondary | ICD-10-CM | POA: Insufficient documentation

## 2017-08-14 LAB — CBC WITH DIFFERENTIAL/PLATELET
Basophils Absolute: 0 10*3/uL (ref 0.0–0.1)
Basophils Relative: 0 %
EOS PCT: 5 %
Eosinophils Absolute: 0.2 10*3/uL (ref 0.0–0.7)
HEMATOCRIT: 39.7 % (ref 36.0–46.0)
Hemoglobin: 12.9 g/dL (ref 12.0–15.0)
LYMPHS ABS: 2.3 10*3/uL (ref 0.7–4.0)
LYMPHS PCT: 42 %
MCH: 26 pg (ref 26.0–34.0)
MCHC: 32.5 g/dL (ref 30.0–36.0)
MCV: 80 fL (ref 78.0–100.0)
MONO ABS: 0.5 10*3/uL (ref 0.1–1.0)
MONOS PCT: 10 %
NEUTROS ABS: 2.3 10*3/uL (ref 1.7–7.7)
Neutrophils Relative %: 43 %
PLATELETS: 344 10*3/uL (ref 150–400)
RBC: 4.96 MIL/uL (ref 3.87–5.11)
RDW: 16.4 % — AB (ref 11.5–15.5)
WBC: 5.3 10*3/uL (ref 4.0–10.5)

## 2017-08-14 LAB — BASIC METABOLIC PANEL
ANION GAP: 5 (ref 5–15)
BUN: 13 mg/dL (ref 6–20)
CHLORIDE: 102 mmol/L (ref 101–111)
CO2: 29 mmol/L (ref 22–32)
Calcium: 9 mg/dL (ref 8.9–10.3)
Creatinine, Ser: 0.72 mg/dL (ref 0.44–1.00)
GFR calc Af Amer: >60 mL/min (ref >60)
GFR calc non Af Amer: >60 mL/min (ref >60)
GLUCOSE: 84 mg/dL (ref 65–99)
POTASSIUM: 4.5 mmol/L (ref 3.5–5.1)
Sodium: 136 mmol/L (ref 135–145)

## 2017-08-14 NOTE — Pre-Procedure Instructions (Signed)
CBC with diff faxed to Dr. Fredricka Bonineonnor via epic.

## 2017-08-19 ENCOUNTER — Encounter (HOSPITAL_COMMUNITY)
Admission: RE | Disposition: A | Discharge status: Home / Self Care | Payer: Self-pay | Source: Ambulatory Visit | Attending: Surgery

## 2017-08-19 ENCOUNTER — Ambulatory Visit (HOSPITAL_COMMUNITY)
Admission: RE | Admit: 2017-08-19 | Discharge: 2017-08-19 | Disposition: A | Discharge status: Home / Self Care | Payer: Medicaid Other | Source: Ambulatory Visit | Attending: Surgery | Admitting: Surgery

## 2017-08-19 SURGERY — EXCISION, HIDRADENITIS, AXILLA
Anesthesia: General

## 2017-08-19 MED ORDER — CHLORHEXIDINE GLUCONATE 4 % EX LIQD: 60.0000 mL | Freq: Once | CUTANEOUS | Status: DC

## 2017-08-19 MED ORDER — VANCOMYCIN HCL IN DEXTROSE 1-5 GM/200ML-% IV SOLN: 1000.0000 mg | INTRAVENOUS | Status: DC

## 2017-08-19 MED ORDER — GABAPENTIN 300 MG PO CAPS: 300.0000 mg | ORAL_CAPSULE | ORAL | Status: DC

## 2017-08-19 MED ORDER — CELECOXIB 200 MG PO CAPS: 200.0000 mg | ORAL_CAPSULE | ORAL | Status: DC

## 2017-08-19 MED ORDER — ACETAMINOPHEN 500 MG PO TABS: 1000.0000 mg | ORAL_TABLET | ORAL | Status: DC

## 2017-08-19 NOTE — Progress Notes (Signed)
Patient arriving late for scheduled surgery, had been instructed to arrive at 0745.  Dr. Fredricka Bonineconnor notified of late arrival.  Also, that patient has not arranged for responsible adult to stay with her for 24 hours postop. Dr. Fredricka Bonineonnor cancelled case, to reschedule possibly for next week.  Dr. Fredricka Bonineonnor in to see patient and discuss plan with patient and importance of following scheduled times.  Patient to be discharged home, to call office later today to arrange resceduling of planned surgery.

## 2017-08-19 NOTE — Progress Notes (Addendum)
Shannon Velasquez 01/09/1986. Surgery cancelled today.  Pt to call Dr. Derrill Memoonnor's office for reschedule date and time.  Please Call [phone] or [phone] to obtain this information.

## 2017-08-19 NOTE — Progress Notes (Signed)
Patient arrived to preop holding at approximately 8:45am, over an hour after she had been asked to arrive. When called by pre-op she informed them that she needed to drop off her children at the bus and her mother at work as the reason for running late.  She arrived with 3 of her children and her mother with her. She informed preop that her mother would not be able to stay with her this evening as originally planned and stated that her 386 year old son would stay with her.  I spoke with the patient and informed her that surgery is cancelled- she needs to be here on time and she needs a firm plan for her ride home and an adult to stay with her for at least the day time after surgery. Will ask our schedulers to reschedule the case as soon as possible.

## 2017-10-01 ENCOUNTER — Emergency Department (HOSPITAL_COMMUNITY)
Admission: EM | Admit: 2017-10-01 | Discharge: 2017-10-01 | Disposition: A | Discharge status: Home / Self Care | Payer: Medicaid Other | Attending: Emergency Medicine | Admitting: Emergency Medicine

## 2017-10-01 ENCOUNTER — Encounter (HOSPITAL_COMMUNITY): Payer: Self-pay | Admitting: Emergency Medicine

## 2017-10-01 ENCOUNTER — Other Ambulatory Visit: Payer: Self-pay

## 2017-10-01 DIAGNOSIS — J029 Acute pharyngitis, unspecified: Secondary | ICD-10-CM | POA: Diagnosis not present

## 2017-10-01 DIAGNOSIS — R0989 Other specified symptoms and signs involving the circulatory and respiratory systems: Secondary | ICD-10-CM | POA: Diagnosis not present

## 2017-10-01 DIAGNOSIS — R070 Pain in throat: Secondary | ICD-10-CM | POA: Diagnosis present

## 2017-10-01 DIAGNOSIS — Z79899 Other long term (current) drug therapy: Secondary | ICD-10-CM | POA: Diagnosis not present

## 2017-10-01 DIAGNOSIS — R05 Cough: Secondary | ICD-10-CM | POA: Diagnosis not present

## 2017-10-01 DIAGNOSIS — M791 Myalgia, unspecified site: Secondary | ICD-10-CM | POA: Diagnosis not present

## 2017-10-01 DIAGNOSIS — R6883 Chills (without fever): Secondary | ICD-10-CM | POA: Insufficient documentation

## 2017-10-01 LAB — RAPID STREP SCREEN (MED CTR MEBANE ONLY): Streptococcus, Group A Screen (Direct): NEGATIVE

## 2017-10-01 MED ORDER — ACETAMINOPHEN 500 MG PO TABS: 1000.0000 mg | ORAL_TABLET | Freq: Once | ORAL | Status: DC

## 2017-10-01 MED ORDER — LEVOCETIRIZINE DIHYDROCHLORIDE 5 MG PO TABS: 5.0000 mg | ORAL_TABLET | Freq: Every day | ORAL | 0 refills | Status: DC

## 2017-10-01 MED ORDER — NAPROXEN 375 MG PO TABS: 375.0000 mg | ORAL_TABLET | Freq: Two times a day (BID) | ORAL | 0 refills | Status: DC

## 2017-10-01 NOTE — Discharge Instructions (Signed)
Get help right away if: °You have new symptoms, such as vomiting, severe headache, stiff or painful neck, chest pain, or shortness of breath. °You have severe throat pain, drooling, or changes in your voice. °You have swelling of the neck, or the skin on the neck becomes red and tender. °You have signs of dehydration, such as fatigue, dry mouth, and decreased urination. °You become increasingly sleepy, or you cannot wake up completely. °Your joints become red or painful. °

## 2017-10-01 NOTE — ED Triage Notes (Signed)
Patient complains of sore throat that started last night, states he daughter was also sick recently. Patient in no apparent distress at this time.

## 2017-10-01 NOTE — ED Notes (Signed)
Pt refusing to speak with this RN, requesting for her young daughter to speak.

## 2017-10-01 NOTE — ED Provider Notes (Signed)
[facility]   CSN: [id number] Arrival date & time: 10/01/17  [id number]     History   Chief Complaint Chief Complaint  Patient presents with  . Sore Throat    HPI Shannon Velasquez is a 32 y.o. female presents the emergency department chief complaint of sore throat.  Patient states that her symptoms began last night.  She has associated body aches and chills and pain in her throat when she swallows.  She has an associated runny nose and slight cough.  She denies any exposure to strep throat.  She is unsure if she has a history of seasonal allergies but denies sneezing, itching watering eyes.  Patient is able to swallow and tolerate her own fluids she has not had any medications for symptom treatment prior to arrival  HPI  Past Medical History:  Diagnosis Date  . Epilepsy (HCC)    last seizure age [age] years    There are no active problems to display for this patient.   Past Surgical History:  Procedure Laterality Date  . BARTHOLIN CYST MARSUPIALIZATION      OB History    Gravida Para Term Preterm AB Living   1             SAB TAB Ectopic Multiple Live Births                   Home Medications    Prior to Admission medications   Medication Sig Start Date End Date Taking? Authorizing Provider  acetaminophen (TYLENOL) 500 MG tablet Take 2 tablets (1,000 mg total) by mouth every 8 (eight) hours as needed. Patient not taking: Reported on 08/05/2017 09/27/13   Elwin MochaWalden, Blair, MD  azithromycin (ZITHROMAX) 250 MG tablet Take 1 tablet (250 mg total) by mouth daily. Take first 2 tablets together, then 1 every day until finished. Patient not taking: Reported on 08/05/2017 06/11/17   Michela PitcherFawze, Mina A, PA-C  clindamycin (CLEOCIN) 150 MG capsule Take 1 capsule (150 mg total) by mouth every 6 (six) hours. Patient not taking: Reported on 06/11/2017 07/15/16   Arby BarrettePfeiffer, Marcy, MD  clotrimazole (LOTRIMIN) 1 % cream Apply to affected area 2 times  daily Patient not taking: Reported on 07/15/2016 06/04/13   Elpidio AnisUpstill, Shari, PA-C  cyclobenzaprine (FLEXERIL) 10 MG tablet Take 1 tablet (10 mg total) by mouth 2 (two) times daily as needed for muscle spasms. Patient not taking: Reported on 07/15/2016 09/27/13   Elwin MochaWalden, Blair, MD  HYDROcodone-acetaminophen (NORCO/VICODIN) 5-325 MG per tablet Take 1 tablet by mouth every 4 (four) hours as needed. Patient not taking: Reported on 07/15/2016 01/13/14   Elpidio AnisUpstill, Shari, PA-C  ibuprofen (ADVIL,MOTRIN) 200 MG tablet Take 200-400 mg by mouth every 6 (six) hours as needed for mild pain.    [provider]  ibuprofen (ADVIL,MOTRIN) 800 MG tablet Take 1 tablet (800 mg total) by mouth 3 (three) times daily. Patient not taking: Reported on 06/11/2017 07/15/16   Arby BarrettePfeiffer, Marcy, MD  levocetirizine (XYZAL) 5 MG tablet Take 1 tablet (5 mg total) by mouth at bedtime. 10/01/17   Sara Selvidge, Cammy CopaAbigail, PA-C  naproxen (NAPROSYN) 375 MG tablet Take 1 tablet (375 mg total) by mouth 2 (two) times daily. 10/01/17   Alfredia Desanctis, Cammy CopaAbigail, PA-C  sulfamethoxazole-trimethoprim (BACTRIM DS,SEPTRA DS) 800-160 MG tablet Take 1 tablet by mouth 2 (two) times daily. Patient not taking: Reported on 08/05/2017 07/06/17   Kellie ShropshireShrosbree, Emily J, PA-C    Family History No family history on file.  Social History Social History   Tobacco Use  . Smoking status: Never Smoker  . Smokeless tobacco: Never Used  Substance Use Topics  . Alcohol use: No  . Drug use: No     Allergies   Penicillins   Review of Systems Review of Systems  Constitutional: Positive for chills and fatigue.  HENT: Positive for sore throat. Negative for dental problem, ear pain, trouble swallowing and voice change.   Respiratory: Positive for cough. Negative for wheezing.       Physical Exam Updated Vital Signs BP 111/74 (BP Location: Right Arm)   Pulse 93   Temp 99 F (37.2 C) (Oral)   Resp 14   LMP 08/21/2017   SpO2 100%   Physical Exam    Constitutional: She is oriented to person, place, and time. She appears well-developed and well-nourished. No distress.  HENT:  Head: Normocephalic and atraumatic.  Mouth/Throat: Uvula is midline. No uvula swelling. No oropharyngeal exudate, posterior oropharyngeal edema or posterior oropharyngeal erythema.  Bilateral tonsillar erythema without exudate.  Bilateral tonsillar adenopathy which is tender to palpation  Eyes: Conjunctivae are normal. No scleral icterus.  Neck: Normal range of motion.  Cardiovascular: Normal rate, regular rhythm and normal heart sounds. Exam reveals no gallop and no friction rub.  No murmur heard. Pulmonary/Chest: Effort normal and breath sounds normal. No respiratory distress.  Abdominal: Soft. Bowel sounds are normal. She exhibits no distension and no mass. There is no tenderness. There is no guarding.  Neurological: She is alert and oriented to person, place, and time.  Skin: Skin is warm and dry. She is not diaphoretic.  Psychiatric: Her behavior is normal.  Nursing note and vitals reviewed.    ED Treatments / Results  Labs (all labs ordered are listed, but only abnormal results are displayed) Labs Reviewed  RAPID STREP SCREEN (NOT AT [facility])  CULTURE, GROUP A STREP [facility])    EKG  EKG Interpretation None       Radiology No results found.  Procedures Procedures (including critical care time)  Medications Ordered in ED Medications - No data to display   Initial Impression / Assessment and Plan / ED Course  I have reviewed the triage vital signs and the nursing notes.  Pertinent labs & imaging results that were available during my care of the patient were reviewed by me and considered in my medical decision making (see chart for details).    Pt afebrile without tonsillar exudate, negative strep. Presents with mild cervical lymphadenopathy, & dysphagia; diagnosis of viral pharyngitis. No abx indicated. DC w symptomatic tx for pain  Pt does  not appear dehydrated, but did discuss importance of water rehydration. Presentation non concerning for PTA or infxn spread to soft tissue. No trismus or uvula deviation. Specific return precautions discussed. Pt able to drink water in ED without difficulty with intact air way. Recommended PCP follow up.   Final Clinical Impressions(s) / ED Diagnoses   Final diagnoses:  Pharyngitis, unspecified etiology    ED Discharge Orders        Ordered    naproxen (NAPROSYN) 375 MG tablet  2 times daily     10/01/17 1031    levocetirizine (XYZAL) 5 MG tablet  Daily at bedtime     10/01/17 1031       Arthor Captain, PA-C 10/01/17 1039    Raeford Razor, MD 10/01/17 1516

## 2017-10-01 NOTE — ED Notes (Signed)
ED Provider at bedside. 

## 2017-10-03 LAB — CULTURE, GROUP A STREP (THRC)

## 2018-07-08 ENCOUNTER — Encounter (HOSPITAL_COMMUNITY): Payer: Self-pay | Admitting: *Deleted

## 2018-07-08 ENCOUNTER — Emergency Department (HOSPITAL_COMMUNITY)
Admission: EM | Admit: 2018-07-08 | Discharge: 2018-07-08 | Disposition: A | Discharge status: Home / Self Care | Payer: Medicaid Other | Attending: Emergency Medicine | Admitting: Emergency Medicine

## 2018-07-08 DIAGNOSIS — R05 Cough: Secondary | ICD-10-CM | POA: Insufficient documentation

## 2018-07-08 DIAGNOSIS — R0981 Nasal congestion: Secondary | ICD-10-CM | POA: Insufficient documentation

## 2018-07-08 DIAGNOSIS — M7918 Myalgia, other site: Secondary | ICD-10-CM | POA: Insufficient documentation

## 2018-07-08 DIAGNOSIS — B349 Viral infection, unspecified: Secondary | ICD-10-CM | POA: Insufficient documentation

## 2018-07-08 DIAGNOSIS — F509 Eating disorder, unspecified: Secondary | ICD-10-CM | POA: Insufficient documentation

## 2018-07-08 LAB — INFLUENZA PANEL BY PCR (TYPE A & B)
INFLAPCR: NEGATIVE
INFLBPCR: POSITIVE — AB

## 2018-07-08 MED ORDER — BALOXAVIR MARBOXIL(80 MG DOSE) 2 X 40 MG PO TBPK: 2.0000 | ORAL_TABLET | Freq: Once | ORAL | 0 refills | Status: AC

## 2018-07-08 MED ORDER — IBUPROFEN 600 MG PO TABS: 600.0000 mg | ORAL_TABLET | Freq: Four times a day (QID) | ORAL | 0 refills | Status: DC | PRN

## 2018-07-08 NOTE — ED Triage Notes (Signed)
To ED for eval of fever and congestion for the past 2 days. OTC meds taken with minimal. Pts children with cough but no fever prior to pt getting sick

## 2018-07-08 NOTE — ED Provider Notes (Signed)
[facility] EMERGENCY DEPARTMENT Provider Note   CSN: [id number] Arrival date & time: 07/08/18  1012     History   Chief Complaint Chief Complaint  Patient presents with  . Nasal Congestion  . Fever    HPI Shannon Velasquez is a 32 y.o. female.  The history is provided by the patient. No language interpreter was used.  Fever       32 year old female presenting to the ED for evaluation of flulike symptoms.  Patient report for the past 2 days she has had subjective fever, chills, body aches, congestion, coughing, decrease in appetite, and one bout of nausea and vomiting.  Symptoms moderate in severity.  She has not had a flu shot.  She denies any recent travel.  She does report recent sick contact.  She is not pregnant.  She tries over-the-counter medication at home with minimal relief.  Past Medical History:  Diagnosis Date  . Epilepsy (HCC)    last seizure age 40 years    There are no active problems to display for this patient.   Past Surgical History:  Procedure Laterality Date  . BARTHOLIN CYST MARSUPIALIZATION       OB History    Gravida  1   Para      Term      Preterm      AB      Living        SAB      TAB      Ectopic      Multiple      Live Births               Home Medications    Prior to Admission medications   Medication Sig Start Date End Date Taking? Authorizing Provider  acetaminophen (TYLENOL) 500 MG tablet Take 2 tablets (1,000 mg total) by mouth every 8 (eight) hours as needed. Patient not taking: Reported on 08/05/2017 09/27/13   Elwin Mocha, MD  azithromycin (ZITHROMAX) 250 MG tablet Take 1 tablet (250 mg total) by mouth daily. Take first 2 tablets together, then 1 every day until finished. Patient not taking: Reported on 08/05/2017 06/11/17   Michela Pitcher A, PA-C  clindamycin (CLEOCIN) 150 MG capsule Take 1 capsule (150 mg total) by mouth every 6 (six) hours. Patient not taking: Reported on 06/11/2017  07/15/16   Arby Barrette, MD  clotrimazole (LOTRIMIN) 1 % cream Apply to affected area 2 times daily Patient not taking: Reported on 07/15/2016 06/04/13   Elpidio Anis, PA-C  cyclobenzaprine (FLEXERIL) 10 MG tablet Take 1 tablet (10 mg total) by mouth 2 (two) times daily as needed for muscle spasms. Patient not taking: Reported on 07/15/2016 09/27/13   Elwin Mocha, MD  HYDROcodone-acetaminophen (NORCO/VICODIN) 5-325 MG per tablet Take 1 tablet by mouth every 4 (four) hours as needed. Patient not taking: Reported on 07/15/2016 01/13/14   Elpidio Anis, PA-C  ibuprofen (ADVIL,MOTRIN) 200 MG tablet Take 200-400 mg by mouth every 6 (six) hours as needed for mild pain.    [provider]  ibuprofen (ADVIL,MOTRIN) 800 MG tablet Take 1 tablet (800 mg total) by mouth 3 (three) times daily. Patient not taking: Reported on 06/11/2017 07/15/16   Arby Barrette, MD  levocetirizine (XYZAL) 5 MG tablet Take 1 tablet (5 mg total) by mouth at bedtime. 10/01/17   Harris, Cammy Copa, PA-C  naproxen (NAPROSYN) 375 MG tablet Take 1 tablet (375 mg total) by mouth 2 (two) times daily. 10/01/17   Arthor Captain,  PA-C  sulfamethoxazole-trimethoprim (BACTRIM DS,SEPTRA DS) 800-160 MG tablet Take 1 tablet by mouth 2 (two) times daily. Patient not taking: Reported on 08/05/2017 07/06/17   Kellie ShropshireShrosbree, Emily J, PA-C    Family History No family history on file.  Social History Social History   Tobacco Use  . Smoking status: Never Smoker  . Smokeless tobacco: Never Used  Substance Use Topics  . Alcohol use: No  . Drug use: No     Allergies   Penicillins   Review of Systems Review of Systems  Constitutional: Positive for fever.  All other systems reviewed and are negative.    Physical Exam Updated Vital Signs BP 123/77 (BP Location: Right Arm)   Pulse 96   Temp 99.2 F (37.3 C) (Oral) Comment: Tylenol 1 hr ago  SpO2 98%   Physical Exam Vitals signs and nursing note reviewed.    Constitutional:      General: She is not in acute distress.    Appearance: She is well-developed.  HENT:     Head: Atraumatic.     Right Ear: Tympanic membrane normal.     Left Ear: Tympanic membrane normal.     Nose: Nose normal.     Mouth/Throat:     Mouth: Mucous membranes are moist.  Eyes:     Conjunctiva/sclera: Conjunctivae normal.  Neck:     Musculoskeletal: Neck supple. No neck rigidity.  Cardiovascular:     Rate and Rhythm: Normal rate and regular rhythm.     Pulses: Normal pulses.     Heart sounds: Normal heart sounds.  Pulmonary:     Effort: Pulmonary effort is normal.     Breath sounds: Normal breath sounds. No wheezing.  Abdominal:     Palpations: Abdomen is soft.     Tenderness: There is no abdominal tenderness.  Lymphadenopathy:     Cervical: No cervical adenopathy.  Skin:    Findings: No rash.  Neurological:     Mental Status: She is alert.      ED Treatments / Results  Labs (all labs ordered are listed, but only abnormal results are displayed) Labs Reviewed  INFLUENZA PANEL BY PCR (TYPE A & B)    EKG None  Radiology No results found.  Procedures Procedures (including critical care time)  Medications Ordered in ED Medications - No data to display   Initial Impression / Assessment and Plan / ED Course  I have reviewed the triage vital signs and the nursing notes.  Pertinent labs & imaging results that were available during my care of the patient were reviewed by me and considered in my medical decision making (see chart for details).     BP 123/77 (BP Location: Right Arm)   Pulse 96   Temp 99.2 F (37.3 C) (Oral) Comment: Tylenol 1 hr ago  SpO2 98%    Final Clinical Impressions(s) / ED Diagnoses   Final diagnoses:  Viral illness    ED Discharge Orders         Ordered    Baloxavir Marboxil,80 MG Dose, (XOFLUZA) 2 x 40 MG TBPK   Once     07/08/18 1045    ibuprofen (ADVIL,MOTRIN) 600 MG tablet  Every 6 hours PRN     07/08/18  1045         10:41 AM  Patient with symptoms consistent with influenza.  Vitals are stable, low-grade fever.  No signs of dehydration, tolerating PO's.  Lungs are clear. Due to patient's presentation and physical exam a  chest x-ray was not ordered bc likely diagnosis of flu.  Discussed the cost versus benefit of Tamiflu treatment with the patient.  The patient understands that symptoms are greater than the recommended 24-48 hour window of treatment.  Patient will be discharged with instructions to orally hydrate, rest, and use over-the-counter medications such as anti-inflammatories ibuprofen and Aleve for muscle aches and Tylenol for fever.  Patient will also be given a cough suppressant.     Fayrene Helperran, Kylei Purington, PA-C 07/08/18 1046    Shaune PollackIsaacs, Cameron, MD 07/10/18 [phone]

## 2018-07-12 ENCOUNTER — Encounter (HOSPITAL_BASED_OUTPATIENT_CLINIC_OR_DEPARTMENT_OTHER): Payer: Self-pay | Admitting: *Deleted

## 2018-07-12 ENCOUNTER — Emergency Department (HOSPITAL_BASED_OUTPATIENT_CLINIC_OR_DEPARTMENT_OTHER)
Admission: EM | Admit: 2018-07-12 | Discharge: 2018-07-12 | Disposition: A | Discharge status: Home / Self Care | Payer: Self-pay | Attending: Emergency Medicine | Admitting: Emergency Medicine

## 2018-07-12 ENCOUNTER — Emergency Department (HOSPITAL_BASED_OUTPATIENT_CLINIC_OR_DEPARTMENT_OTHER): Payer: Self-pay

## 2018-07-12 ENCOUNTER — Other Ambulatory Visit: Payer: Self-pay

## 2018-07-12 DIAGNOSIS — Z79899 Other long term (current) drug therapy: Secondary | ICD-10-CM | POA: Insufficient documentation

## 2018-07-12 DIAGNOSIS — R059 Cough, unspecified: Secondary | ICD-10-CM

## 2018-07-12 DIAGNOSIS — R0789 Other chest pain: Secondary | ICD-10-CM | POA: Insufficient documentation

## 2018-07-12 DIAGNOSIS — R05 Cough: Secondary | ICD-10-CM | POA: Insufficient documentation

## 2018-07-12 NOTE — Discharge Instructions (Addendum)
You can alternate with ibuprofen and Tylenol as prescribed over-the-counter.  You can use ice to your chest to help bring inflammation down.  Continue taking your cough medicine over-the-counter.  Please return the emergency department if develop any new or worsening symptoms.

## 2018-07-12 NOTE — ED Triage Notes (Signed)
Pt was diagnosed with Flu B last Wednesday. She still has a cough and now her chest hurts from cough.

## 2018-07-12 NOTE — ED Provider Notes (Signed)
[facility] EMERGENCY DEPARTMENT Provider Note   CSN: [id number] Arrival date & time: 07/12/18  1002     History   Chief Complaint Chief Complaint  Patient presents with  . Cough    HPI Shannon Velasquez is a 32 y.o. female with history of epilepsy who presents with a 6-day history of cough.  Patient was seen 3 days ago and diagnosed with influenza B.  She reports a lot of her symptoms have gotten better, however she continues to cough and have chest pain with coughing or laughing.  She has no chest pain at rest.  She denies shortness of breath.  She reports she has been taking TheraFlu which has been helping a lot.  Patient denies any abdominal pain, nausea, vomiting in the past few days.  She has not had a fever in 2 or 3 days.  HPI  Past Medical History:  Diagnosis Date  . Epilepsy (HCC)    last seizure age 32 years    There are no active problems to display for this patient.   Past Surgical History:  Procedure Laterality Date  . BARTHOLIN CYST MARSUPIALIZATION       OB History    Gravida  1   Para      Term      Preterm      AB      Living        SAB      TAB      Ectopic      Multiple      Live Births               Home Medications    Prior to Admission medications   Medication Sig Start Date End Date Taking? Authorizing Provider  ibuprofen (ADVIL,MOTRIN) 600 MG tablet Take 1 tablet (600 mg total) by mouth every 6 (six) hours as needed. 07/08/18   Fayrene Helperran, Bowie, PA-C  levocetirizine (XYZAL) 5 MG tablet Take 1 tablet (5 mg total) by mouth at bedtime. 10/01/17   Harris, Cammy CopaAbigail, PA-C  naproxen (NAPROSYN) 375 MG tablet Take 1 tablet (375 mg total) by mouth 2 (two) times daily. 10/01/17   Harris, Cammy CopaAbigail, PA-C  sulfamethoxazole-trimethoprim (BACTRIM DS,SEPTRA DS) 800-160 MG tablet Take 1 tablet by mouth 2 (two) times daily. Patient not taking: Reported on 08/05/2017 07/06/17   Kellie ShropshireShrosbree, Emily J, PA-C    Family History History reviewed.  No pertinent family history.  Social History Social History   Tobacco Use  . Smoking status: Never Smoker  . Smokeless tobacco: Never Used  Substance Use Topics  . Alcohol use: No  . Drug use: No     Allergies   Penicillins   Review of Systems Review of Systems  Constitutional: Negative for chills and fever.  HENT: Negative for facial swelling and sore throat.   Respiratory: Positive for cough. Negative for shortness of breath.   Cardiovascular: Positive for chest pain (with coughing or laughing).  Gastrointestinal: Negative for abdominal pain, nausea and vomiting.  Genitourinary: Negative for dysuria.  Musculoskeletal: Negative for back pain.  Skin: Negative for rash and wound.  Neurological: Negative for headaches.  Psychiatric/Behavioral: The patient is not nervous/anxious.      Physical Exam Updated Vital Signs BP 112/76   Pulse 90   Temp 98.7 F (37.1 C) (Oral)   Resp 18   Ht 5\' 3"  (1.6 m)   Wt 90.7 kg   LMP 07/11/2018 (Exact Date)   SpO2 97%   BMI 35.43  kg/m   Physical Exam Vitals signs and nursing note reviewed.  Constitutional:      General: She is not in acute distress.    Appearance: She is well-developed. She is not diaphoretic.  HENT:     Head: Normocephalic and atraumatic.     Mouth/Throat:     Pharynx: No oropharyngeal exudate.  Eyes:     General: No scleral icterus.       Right eye: No discharge.        Left eye: No discharge.     Conjunctiva/sclera: Conjunctivae normal.     Pupils: Pupils are equal, round, and reactive to light.  Neck:     Musculoskeletal: Normal range of motion and neck supple.     Thyroid: No thyromegaly.  Cardiovascular:     Rate and Rhythm: Normal rate and regular rhythm.     Heart sounds: Normal heart sounds. No murmur. No friction rub. No gallop.   Pulmonary:     Effort: Pulmonary effort is normal. No respiratory distress.     Breath sounds: No stridor. Rales (subtle at bilateral bases) present. No wheezing.    Chest:     Chest wall: Tenderness present.    Abdominal:     General: Bowel sounds are normal. There is no distension.     Palpations: Abdomen is soft.     Tenderness: There is no abdominal tenderness. There is no guarding or rebound.  Lymphadenopathy:     Cervical: No cervical adenopathy.  Skin:    General: Skin is warm and dry.     Coloration: Skin is not pale.     Findings: No rash.  Neurological:     Mental Status: She is alert.     Coordination: Coordination normal.      ED Treatments / Results  Labs (all labs ordered are listed, but only abnormal results are displayed) Labs Reviewed - No data to display  EKG None  Radiology Dg Chest 2 View  Result Date: 07/12/2018 CLINICAL DATA:  Cough, chest pain EXAM: CHEST - 2 VIEW COMPARISON:  None. FINDINGS: The heart size and mediastinal contours are within normal limits. Both lungs are clear. The visualized skeletal structures are unremarkable. IMPRESSION: No active cardiopulmonary disease. Electronically Signed   By: Elige KoHetal  Patel   On: 07/12/2018 11:00    Procedures Procedures (including critical care time)  Medications Ordered in ED Medications - No data to display   Initial Impression / Assessment and Plan / ED Course  I have reviewed the triage vital signs and the nursing notes.  Pertinent labs & imaging results that were available during my care of the patient were reviewed by me and considered in my medical decision making (see chart for details).     Patient with suspected chest wall pain because of a week of coughing.  Chest x-ray shows no active cardiopulmonary disease.  Will advise supportive treatment including Tylenol, ibuprofen.  Return precautions discussed.  Patient understands and agrees with plan.  Patient vital stable throughout ED course and discharged in satisfactory condition.  Final Clinical Impressions(s) / ED Diagnoses   Final diagnoses:  Cough  Chest wall pain    ED Discharge Orders     None       Emi HolesLaw, Jackquelyn Sundberg M, PA-C 07/12/18 1217    Derwood KaplanNanavati, Ankit, MD 07/12/18 [phone]

## 2019-07-24 ENCOUNTER — Other Ambulatory Visit: Payer: Self-pay

## 2019-07-24 ENCOUNTER — Encounter (HOSPITAL_BASED_OUTPATIENT_CLINIC_OR_DEPARTMENT_OTHER): Payer: Self-pay | Admitting: Emergency Medicine

## 2019-07-24 ENCOUNTER — Emergency Department (HOSPITAL_BASED_OUTPATIENT_CLINIC_OR_DEPARTMENT_OTHER)
Admission: EM | Admit: 2019-07-24 | Discharge: 2019-07-24 | Disposition: A | Discharge status: Home / Self Care | Payer: Medicaid Other | Attending: Emergency Medicine | Admitting: Emergency Medicine

## 2019-07-24 DIAGNOSIS — Z5321 Procedure and treatment not carried out due to patient leaving prior to being seen by health care provider: Secondary | ICD-10-CM | POA: Insufficient documentation

## 2019-07-24 DIAGNOSIS — J029 Acute pharyngitis, unspecified: Secondary | ICD-10-CM | POA: Insufficient documentation

## 2019-07-24 LAB — GROUP A STREP BY PCR: Group A Strep by PCR: NOT DETECTED

## 2019-07-24 NOTE — ED Notes (Signed)
No answer in lobby. Presumed to have left without being seen.

## 2019-07-24 NOTE — ED Triage Notes (Signed)
Sore throat x 3 days

## 2020-02-20 ENCOUNTER — Emergency Department (HOSPITAL_BASED_OUTPATIENT_CLINIC_OR_DEPARTMENT_OTHER)
Admission: EM | Admit: 2020-02-20 | Discharge: 2020-02-20 | Disposition: A | Discharge status: Home / Self Care | Payer: Medicaid Other | Attending: Emergency Medicine | Admitting: Emergency Medicine

## 2020-02-20 ENCOUNTER — Encounter (HOSPITAL_BASED_OUTPATIENT_CLINIC_OR_DEPARTMENT_OTHER): Payer: Self-pay | Admitting: *Deleted

## 2020-02-20 ENCOUNTER — Other Ambulatory Visit: Payer: Self-pay

## 2020-02-20 DIAGNOSIS — Z20822 Contact with and (suspected) exposure to covid-19: Secondary | ICD-10-CM | POA: Diagnosis not present

## 2020-02-20 DIAGNOSIS — H938X1 Other specified disorders of right ear: Secondary | ICD-10-CM | POA: Diagnosis not present

## 2020-02-20 DIAGNOSIS — J069 Acute upper respiratory infection, unspecified: Secondary | ICD-10-CM | POA: Diagnosis not present

## 2020-02-20 DIAGNOSIS — R05 Cough: Secondary | ICD-10-CM | POA: Diagnosis present

## 2020-02-20 LAB — SARS CORONAVIRUS 2 BY RT PCR (HOSPITAL ORDER, PERFORMED IN ~~LOC~~ HOSPITAL LAB): SARS Coronavirus 2: NEGATIVE

## 2020-02-20 NOTE — Discharge Instructions (Addendum)
Follow your MyChart for your Covid test results.  Quarantine until you know your test results.  If your Covid test is negative, strongly encourage you to get your Covid vaccine in a supervised setting as discussed.  If your Covid test is positive, recommend quarantine at home for a total of 10 days since symptoms started.

## 2020-02-20 NOTE — ED Triage Notes (Signed)
Cold symptoms, runny nose and headache  for 2 days. Denies fever, cough.

## 2020-02-20 NOTE — ED Provider Notes (Signed)
[facility] EMERGENCY DEPARTMENT Provider Note   CSN: [id number] Arrival date & time: 02/20/20  1104     History Chief Complaint  Patient presents with  . Cold symptoms    Shannon Velasquez is a 34 y.o. female.  34 year old female presents with complaint of runny nose, cough and body aches with fatigue x4 days.  No known sick contacts, not vaccinated against COVID-19.  Denies fevers, diarrhea, loss of sense of smell or taste. No other complaints or concerns.  Shannon Velasquez was evaluated in Emergency Department on 02/20/2020 for the symptoms described in the history of present illness. She was evaluated in the context of the global COVID-19 pandemic, which necessitated consideration that the patient might be at risk for infection with the SARS-CoV-2 virus that causes COVID-19. Institutional protocols and algorithms that pertain to the evaluation of patients at risk for COVID-19 are in a state of rapid change based on information released by regulatory bodies including the CDC and federal and state organizations. These policies and algorithms were followed during the patient's care in the ED.         Past Medical History:  Diagnosis Date  . Epilepsy (HCC)    last seizure age 81 years  . Epilepsy (HCC)     There are no problems to display for this patient.   Past Surgical History:  Procedure Laterality Date  . BARTHOLIN CYST MARSUPIALIZATION       OB History    Gravida  2   Para  2   Term      Preterm      AB      Living        SAB      TAB      Ectopic      Multiple      Live Births              History reviewed. No pertinent family history.  Social History   Tobacco Use  . Smoking status: Never Smoker  . Smokeless tobacco: Never Used  Vaping Use  . Vaping Use: Never used  Substance Use Topics  . Alcohol use: No  . Drug use: No    Home Medications Prior to Admission medications   Medication Sig Start Date End Date Taking?  Authorizing Provider  ibuprofen (ADVIL,MOTRIN) 600 MG tablet Take 1 tablet (600 mg total) by mouth every 6 (six) hours as needed. 07/08/18   Fayrene Helper, PA-C  levocetirizine (XYZAL) 5 MG tablet Take 1 tablet (5 mg total) by mouth at bedtime. 10/01/17   Harris, Cammy Copa, PA-C  naproxen (NAPROSYN) 375 MG tablet Take 1 tablet (375 mg total) by mouth 2 (two) times daily. 10/01/17   Harris, Cammy Copa, PA-C  sulfamethoxazole-trimethoprim (BACTRIM DS,SEPTRA DS) 800-160 MG tablet Take 1 tablet by mouth 2 (two) times daily. Patient not taking: Reported on 08/05/2017 07/06/17   Kellie Shropshire, PA-C    Allergies    Penicillins  Review of Systems   Review of Systems  Constitutional: Negative for chills and fever.  HENT: Positive for rhinorrhea. Negative for congestion and sore throat.   Respiratory: Positive for cough. Negative for shortness of breath.   Gastrointestinal: Negative for diarrhea, nausea and vomiting.  Musculoskeletal: Positive for arthralgias and myalgias.  Skin: Negative for rash and wound.  Allergic/Immunologic: Negative for immunocompromised state.  Neurological: Negative for weakness and headaches.  All other systems reviewed and are negative.   Physical Exam Updated Vital Signs BP 106/73 (BP Location:  Right Arm)   Pulse 100   Temp 98.4 F (36.9 C) (Oral)   Resp 16   Ht 5\' 3"  (1.6 m)   Wt 90.7 kg   LMP 02/09/2020   SpO2 96%   BMI 35.43 kg/m   Physical Exam Vitals and nursing note reviewed.  Constitutional:      General: She is not in acute distress.    Appearance: She is well-developed. She is not diaphoretic.  HENT:     Head: Normocephalic and atraumatic.     Right Ear: Ear canal normal. A middle ear effusion is present. Tympanic membrane is not injected, erythematous or bulging.     Left Ear: Tympanic membrane and ear canal normal.     Nose: Nose normal.     Mouth/Throat:     Mouth: Mucous membranes are moist.     Pharynx: No oropharyngeal exudate or  posterior oropharyngeal erythema.     Comments: Tonsil stone left tonsil  Eyes:     Conjunctiva/sclera: Conjunctivae normal.  Cardiovascular:     Rate and Rhythm: Normal rate and regular rhythm.     Pulses: Normal pulses.     Heart sounds: Normal heart sounds.  Pulmonary:     Effort: Pulmonary effort is normal.     Breath sounds: Normal breath sounds.  Musculoskeletal:     Cervical back: Neck supple.  Lymphadenopathy:     Cervical: No cervical adenopathy.  Skin:    General: Skin is warm and dry.     Findings: No erythema or rash.  Neurological:     Mental Status: She is alert and oriented to person, place, and time.  Psychiatric:        Behavior: Behavior normal.     ED Results / Procedures / Treatments   Labs (all labs ordered are listed, but only abnormal results are displayed) Labs Reviewed  SARS CORONAVIRUS 2 BY RT PCR (HOSPITAL ORDER, PERFORMED IN [facility])    EKG None  Radiology No results found.  Procedures Procedures (including critical care time)  Medications Ordered in ED Medications - No data to display  ED Course  I have reviewed the triage vital signs and the nursing notes.  Pertinent labs & imaging results that were available during my care of the patient were reviewed by me and considered in my medical decision making (see chart for details).  Clinical Course as of Feb 19 1418  Mon Feb 20, 2020  881 34 year old female with URI symptoms x4 days without sick contact exposure.  On exam found to have trace effusion in the right ear without evidence of acute otitis media otherwise, also left tonsillar stone without complaint of sore throat.  Recommend testing for Covid, quarantine pending results, symptomatic treatment at home. 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    [LM]    Clinical Course User Index [LM] 34   MDM Rules/Calculators/A&P                          Final Clinical Impression(s) / ED Diagnoses Final diagnoses:  Viral upper respiratory tract infection    Rx / DC Orders ED Discharge Orders    None       Alden Hipp, PA-C 02/20/20 1419    04/21/20, MD 02/20/20  2248  

## 2020-04-13 IMAGING — CR DG CHEST 2V
2 series · 2 of 2 positions shown · non-contrast
Comparison: None.

CLINICAL DATA: Cough, chest pain

EXAM:
CHEST - 2 VIEW

[w chest pa]
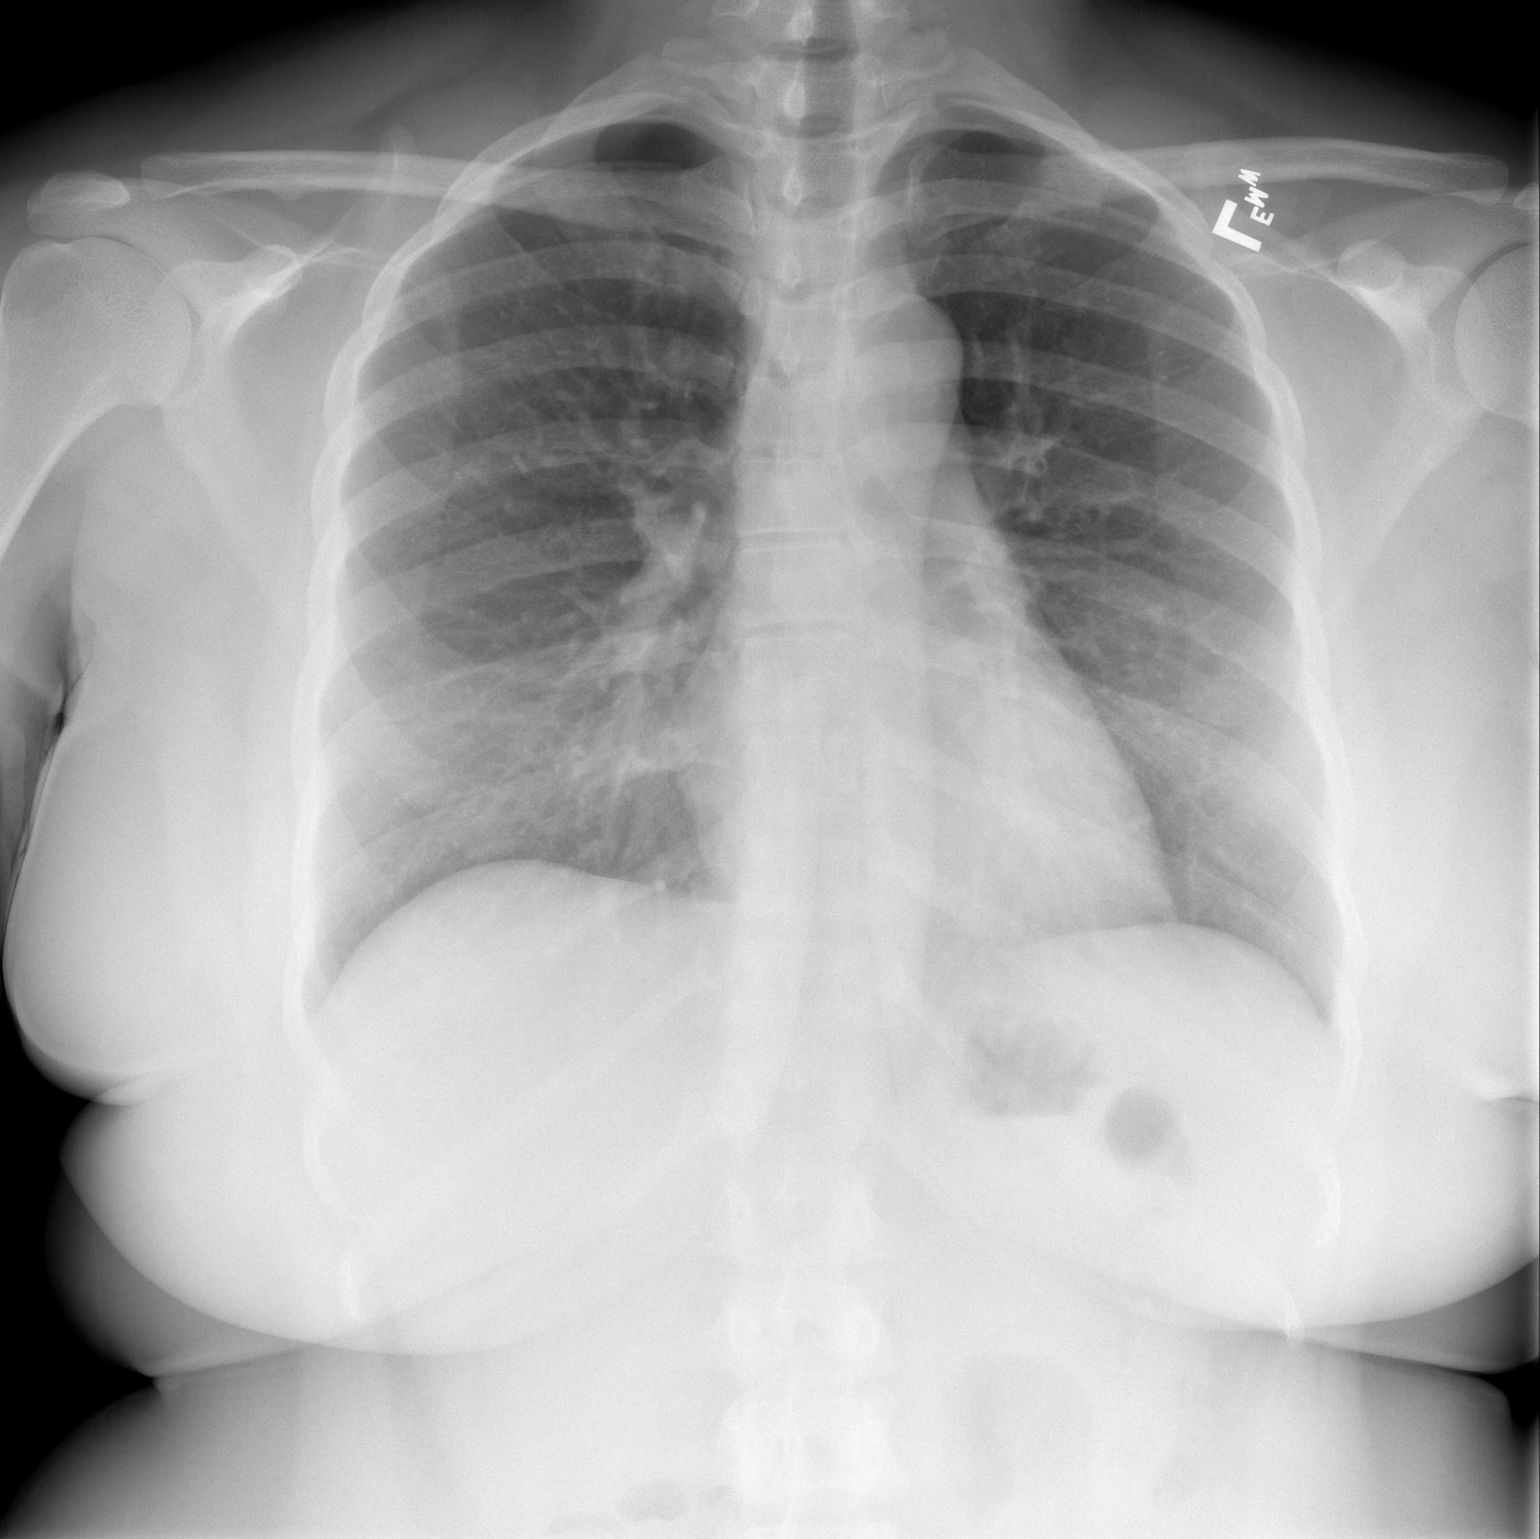

[w chest lat]
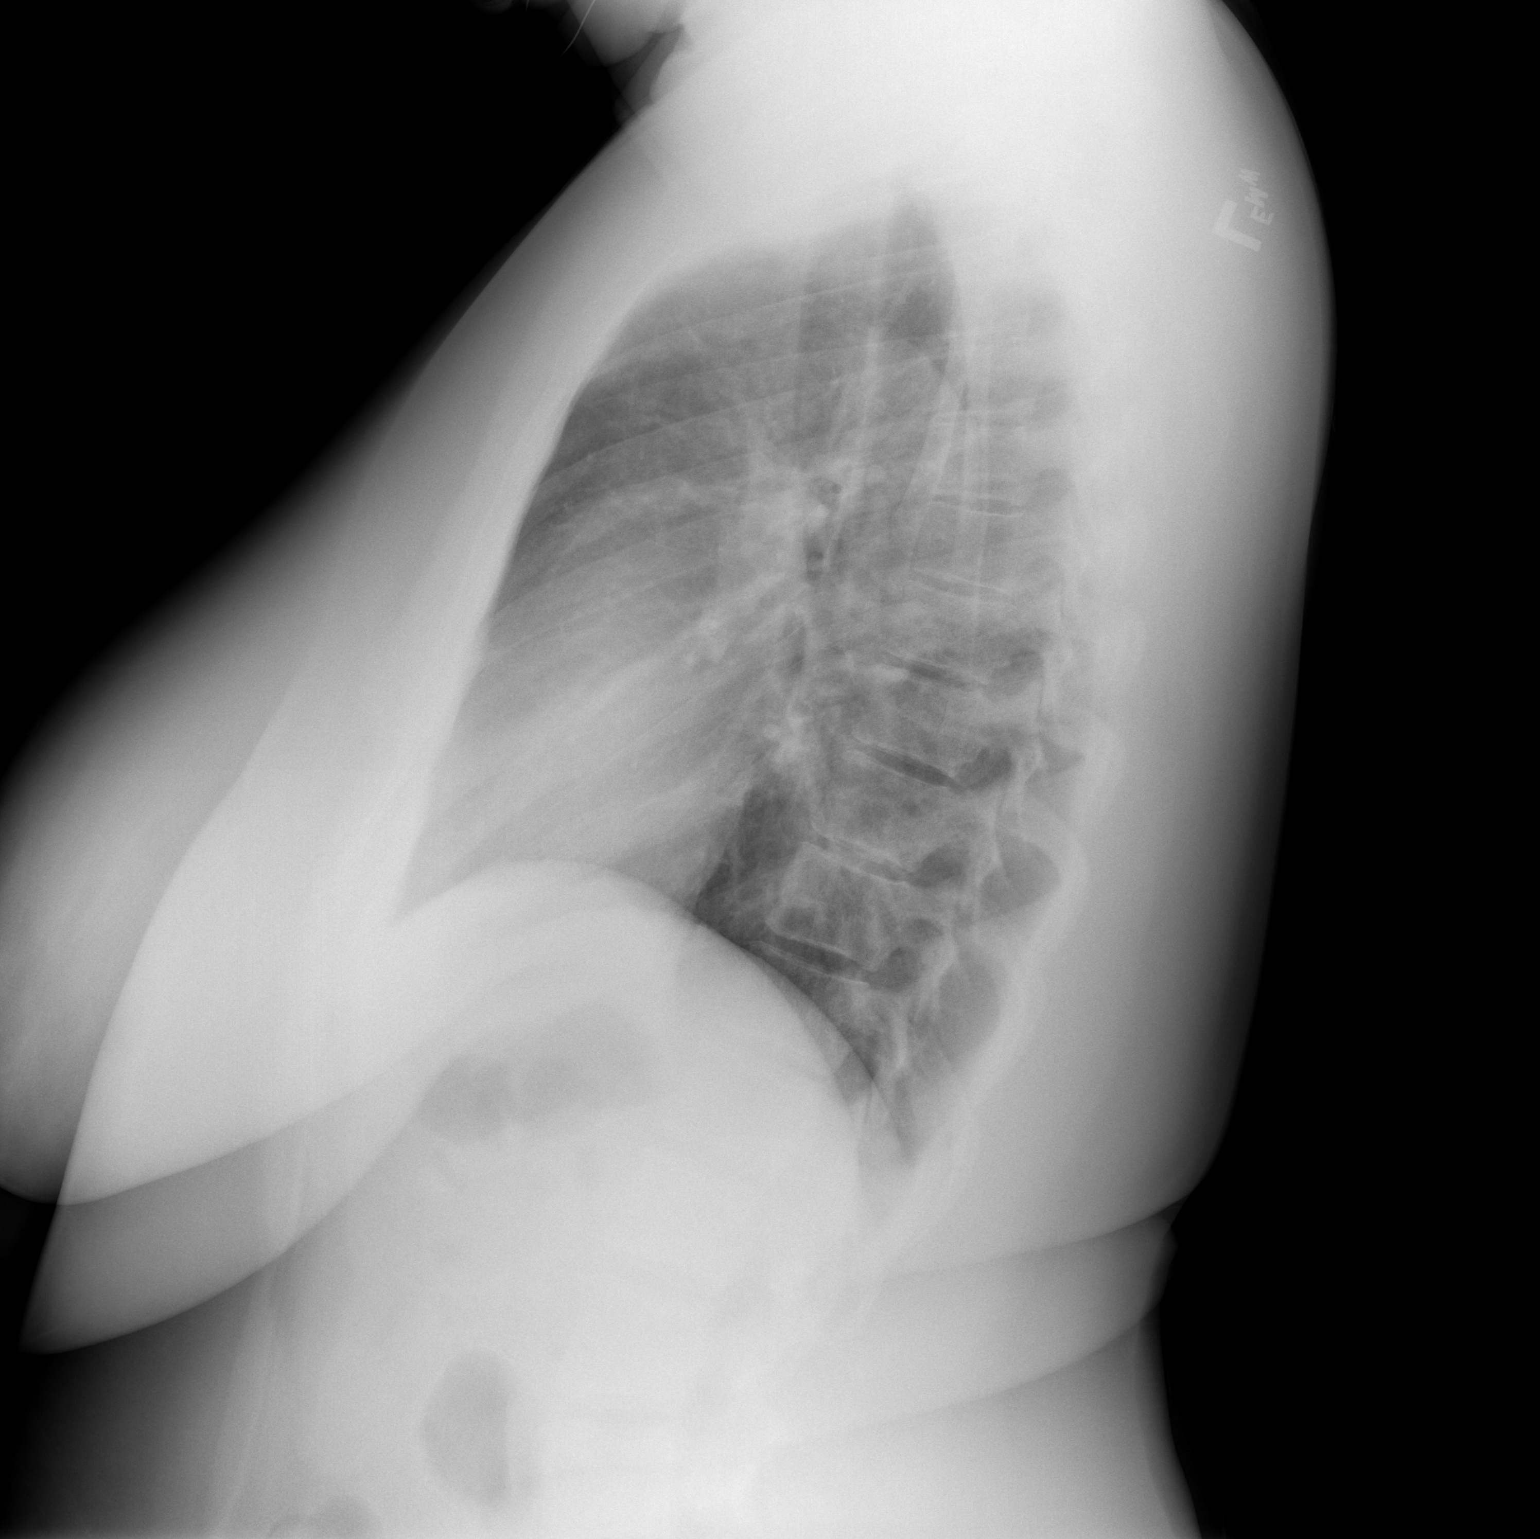

[2 of 2 positions shown; findings below may reference images not displayed]

FINDINGS: The heart size and mediastinal contours are within normal limits.
Both lungs are clear. The visualized skeletal structures are
unremarkable.
IMPRESSION: No active cardiopulmonary disease.

## 2020-11-22 ENCOUNTER — Emergency Department (HOSPITAL_BASED_OUTPATIENT_CLINIC_OR_DEPARTMENT_OTHER)
Admission: EM | Admit: 2020-11-22 | Discharge: 2020-11-22 | Disposition: A | Discharge status: Home / Self Care | Payer: Medicaid Other | Attending: Emergency Medicine | Admitting: Emergency Medicine

## 2020-11-22 ENCOUNTER — Encounter (HOSPITAL_BASED_OUTPATIENT_CLINIC_OR_DEPARTMENT_OTHER): Payer: Self-pay | Admitting: Emergency Medicine

## 2020-11-22 ENCOUNTER — Other Ambulatory Visit: Payer: Self-pay

## 2020-11-22 DIAGNOSIS — Z20822 Contact with and (suspected) exposure to covid-19: Secondary | ICD-10-CM | POA: Diagnosis not present

## 2020-11-22 DIAGNOSIS — J069 Acute upper respiratory infection, unspecified: Secondary | ICD-10-CM | POA: Insufficient documentation

## 2020-11-22 DIAGNOSIS — J029 Acute pharyngitis, unspecified: Secondary | ICD-10-CM | POA: Diagnosis present

## 2020-11-22 LAB — SARS CORONAVIRUS 2 (TAT 6-24 HRS): SARS Coronavirus 2: NEGATIVE

## 2020-11-22 NOTE — Discharge Instructions (Signed)
If you develop high fever, severe cough or cough with blood, trouble breathing, severe headache, neck pain/stiffness, vomiting, or any other new/concerning symptoms then return to the ER for evaluation  

## 2020-11-22 NOTE — ED Triage Notes (Signed)
Reports sore throat, runny nose, and cough since Monday.  Found out yesterday she was exposed to covid on Saturday.  Vaccinated in November.

## 2020-11-22 NOTE — ED Provider Notes (Signed)
[facility] EMERGENCY DEPARTMENT Provider Note   CSN: [id number] Arrival date & time: 11/22/20  5956     History Chief Complaint  Patient presents with  . URI    Shannon Velasquez is a 35 y.o. female.  HPI 35 year old female presents with respiratory symptoms since 5/2.  She states she was exposed to someone with COVID on 4/30.  She is been having rhinorrhea, sore throat, cough.  No fevers or shortness of breath.  No vomiting.  She has had 2 vaccine doses of COVID-19 but did not get the booster.  She went to an urgent care where she had a negative point-of-care antigen test.  Past Medical History:  Diagnosis Date  . Epilepsy (HCC)    last seizure age 51 years  . Epilepsy (HCC)     There are no problems to display for this patient.   Past Surgical History:  Procedure Laterality Date  . BARTHOLIN CYST MARSUPIALIZATION       OB History    Gravida  2   Para  2   Term      Preterm      AB      Living        SAB      IAB      Ectopic      Multiple      Live Births              No family history on file.  Social History   Tobacco Use  . Smoking status: Never Smoker  . Smokeless tobacco: Never Used  Vaping Use  . Vaping Use: Never used  Substance Use Topics  . Alcohol use: No  . Drug use: No    Home Medications Prior to Admission medications   Medication Sig Start Date End Date Taking? Authorizing Provider  ibuprofen (ADVIL,MOTRIN) 600 MG tablet Take 1 tablet (600 mg total) by mouth every 6 (six) hours as needed. 07/08/18   Fayrene Helper, PA-C  levocetirizine (XYZAL) 5 MG tablet Take 1 tablet (5 mg total) by mouth at bedtime. 10/01/17   Harris, Cammy Copa, PA-C  naproxen (NAPROSYN) 375 MG tablet Take 1 tablet (375 mg total) by mouth 2 (two) times daily. 10/01/17   Harris, Cammy Copa, PA-C  sulfamethoxazole-trimethoprim (BACTRIM DS,SEPTRA DS) 800-160 MG tablet Take 1 tablet by mouth 2 (two) times daily. Patient not taking: No sig reported  07/06/17   Kellie Shropshire, PA-C    Allergies    Penicillins  Review of Systems   Review of Systems  Constitutional: Negative for fever.  HENT: Positive for rhinorrhea and sore throat.   Respiratory: Positive for cough. Negative for shortness of breath.   Gastrointestinal: Negative for vomiting.    Physical Exam Updated Vital Signs BP 113/86 (BP Location: Right Arm)   Pulse 70   Temp 97.9 F (36.6 C) (Oral)   Resp 18   Ht 5\' 3"  (1.6 m)   Wt 90.7 kg   LMP 11/07/2020   SpO2 100%   BMI 35.42 kg/m   Physical Exam Vitals and nursing note reviewed.  Constitutional:      Appearance: She is well-developed. She is obese.  HENT:     Head: Normocephalic and atraumatic.     Right Ear: External ear normal.     Left Ear: External ear normal.     Nose: Nose normal.     Mouth/Throat:     Pharynx: No oropharyngeal exudate or posterior oropharyngeal erythema.  Eyes:  General:        Right eye: No discharge.        Left eye: No discharge.  Cardiovascular:     Rate and Rhythm: Normal rate and regular rhythm.     Heart sounds: Normal heart sounds.  Pulmonary:     Effort: Pulmonary effort is normal.     Breath sounds: Normal breath sounds. No wheezing or rales.  Abdominal:     Palpations: Abdomen is soft.     Tenderness: There is no abdominal tenderness.  Skin:    General: Skin is warm and dry.  Neurological:     Mental Status: She is alert.  Psychiatric:        Mood and Affect: Mood is not anxious.     ED Results / Procedures / Treatments   Labs (all labs ordered are listed, but only abnormal results are displayed) Labs Reviewed  SARS CORONAVIRUS 2 (TAT 6-24 HRS)    EKG None  Radiology No results found.  Procedures Procedures   Medications Ordered in ED Medications - No data to display  ED Course  I have reviewed the triage vital signs and the nursing notes.  Pertinent labs & imaging results that were available during my care of the patient were  reviewed by me and considered in my medical decision making (see chart for details).    MDM Rules/Calculators/A&P                          I am suspicious she has a viral URI versus COVID-19.  She is otherwise well-appearing with stable vitals.  My suspicion for pneumonia is pretty low and while we discussed x-ray I do not think would be particularly helpful given the low suspicion, normal vitals/oxygen.  We will send PCR COVID testing and hold out of work until it comes back.  Shannon Velasquez was evaluated in Emergency Department on 11/22/2020 for the symptoms described in the history of present illness. She was evaluated in the context of the global COVID-19 pandemic, which necessitated consideration that the patient might be at risk for infection with the SARS-CoV-2 virus that causes COVID-19. Institutional protocols and algorithms that pertain to the evaluation of patients at risk for COVID-19 are in a state of rapid change based on information released by regulatory bodies including the CDC and federal and state organizations. These policies and algorithms were followed during the patient's care in the ED.  Final Clinical Impression(s) / ED Diagnoses Final diagnoses:  Upper respiratory tract infection, unspecified type    Rx / DC Orders ED Discharge Orders    None       Pricilla Loveless, MD 11/22/20 [phone]

## 2021-07-02 ENCOUNTER — Emergency Department (HOSPITAL_BASED_OUTPATIENT_CLINIC_OR_DEPARTMENT_OTHER)
Admission: EM | Admit: 2021-07-02 | Discharge: 2021-07-02 | Disposition: A | Discharge status: Home / Self Care | Payer: Medicaid Other | Attending: Emergency Medicine | Admitting: Emergency Medicine

## 2021-07-02 ENCOUNTER — Other Ambulatory Visit: Payer: Self-pay

## 2021-07-02 ENCOUNTER — Encounter (HOSPITAL_BASED_OUTPATIENT_CLINIC_OR_DEPARTMENT_OTHER): Payer: Self-pay | Admitting: *Deleted

## 2021-07-02 DIAGNOSIS — Z20822 Contact with and (suspected) exposure to covid-19: Secondary | ICD-10-CM | POA: Insufficient documentation

## 2021-07-02 DIAGNOSIS — L0231 Cutaneous abscess of buttock: Secondary | ICD-10-CM | POA: Insufficient documentation

## 2021-07-02 LAB — RESP PANEL BY RT-PCR (FLU A&B, COVID) ARPGX2
Influenza A by PCR: NEGATIVE
Influenza B by PCR: NEGATIVE
SARS Coronavirus 2 by RT PCR: NEGATIVE

## 2021-07-02 LAB — CBC WITH DIFFERENTIAL/PLATELET
Abs Immature Granulocytes: 0.02 10*3/uL (ref 0.00–0.07)
Basophils Absolute: 0 10*3/uL (ref 0.0–0.1)
Basophils Relative: 0 %
Eosinophils Absolute: 0.1 10*3/uL (ref 0.0–0.5)
Eosinophils Relative: 1 %
HCT: 36.5 % (ref 36.0–46.0)
Hemoglobin: 12.3 g/dL (ref 12.0–15.0)
Immature Granulocytes: 0 %
Lymphocytes Relative: 19 %
Lymphs Abs: 1.8 10*3/uL (ref 0.7–4.0)
MCH: 25.9 pg — ABNORMAL LOW (ref 26.0–34.0)
MCHC: 33.7 g/dL (ref 30.0–36.0)
MCV: 77 fL — ABNORMAL LOW (ref 80.0–100.0)
Monocytes Absolute: 0.9 10*3/uL (ref 0.1–1.0)
Monocytes Relative: 9 %
Neutro Abs: 6.7 10*3/uL (ref 1.7–7.7)
Neutrophils Relative %: 71 %
Platelets: 381 10*3/uL (ref 150–400)
RBC: 4.74 MIL/uL (ref 3.87–5.11)
RDW: 15 % (ref 11.5–15.5)
WBC: 9.4 10*3/uL (ref 4.0–10.5)
nRBC: 0 % (ref 0.0–0.2)

## 2021-07-02 LAB — BASIC METABOLIC PANEL
Anion gap: 9 (ref 5–15)
BUN: 11 mg/dL (ref 6–20)
CO2: 24 mmol/L (ref 22–32)
Calcium: 8.7 mg/dL — ABNORMAL LOW (ref 8.9–10.3)
Chloride: 101 mmol/L (ref 98–111)
Creatinine, Ser: 0.95 mg/dL (ref 0.44–1.00)
GFR, Estimated: >60 mL/min (ref >60)
Glucose, Bld: 110 mg/dL — ABNORMAL HIGH (ref 70–99)
Potassium: 3.4 mmol/L — ABNORMAL LOW (ref 3.5–5.1)
Sodium: 134 mmol/L — ABNORMAL LOW (ref 135–145)

## 2021-07-02 LAB — PROTIME-INR
INR: 1.1 (ref 0.8–1.2)
Prothrombin Time: 14 seconds (ref 11.4–15.2)

## 2021-07-02 LAB — APTT: aPTT: 28 seconds (ref 24–36)

## 2021-07-02 LAB — LACTIC ACID, PLASMA: Lactic Acid, Venous: 1.2 mmol/L (ref 0.5–1.9)

## 2021-07-02 LAB — PREGNANCY, URINE: Preg Test, Ur: NEGATIVE

## 2021-07-02 MED ORDER — CLINDAMYCIN HCL 150 MG PO CAPS: 450.0000 mg | ORAL_CAPSULE | Freq: Three times a day (TID) | ORAL | 0 refills | Status: AC

## 2021-07-02 MED ORDER — IBUPROFEN 400 MG PO TABS
400.0000 mg | ORAL_TABLET | Freq: Once | ORAL | Status: AC
  Administered 2021-07-02: 400 mg via ORAL
  Filled 2021-07-02: qty 1

## 2021-07-02 MED ORDER — SODIUM CHLORIDE 0.9 % IV BOLUS
1000.0000 mL | Freq: Once | INTRAVENOUS | Status: AC
  Administered 2021-07-02: 1000 mL via INTRAVENOUS

## 2021-07-02 MED ORDER — LIDOCAINE-EPINEPHRINE (PF) 2 %-1:200000 IJ SOLN
10.0000 mL | Freq: Once | INTRAMUSCULAR | Status: AC
  Administered 2021-07-02: 10 mL via INTRADERMAL
  Filled 2021-07-02: qty 20

## 2021-07-02 NOTE — ED Provider Notes (Incomplete)
[facility] EMERGENCY DEPARTMENT Provider Note   CSN: [id number] Arrival date & time: 07/02/21  1425     History Chief Complaint  Patient presents with   Abscess    Shannon Velasquez is a 35 y.o. female states that beginning Friday she noticed an abscess to her left gluteal cleft. Patient states that the abscess has progressively gotten worse. She states she has tried to relieve pressure using warm compresses and epsom salt with mild to moderate relief. Patient has a history of abscesses that typically occur in her armpits but has never had one on her gluteal cleft. Patient denies history of IV drug use. Patient endorses subjective fevers. Patient denies nausea, vomiting, diarrhea, abdominal pain.    Abscess     Past Medical History:  Diagnosis Date   Epilepsy (HCC)    last seizure age 68 years   Epilepsy (HCC)     There are no problems to display for this patient.   Past Surgical History:  Procedure Laterality Date   BARTHOLIN CYST MARSUPIALIZATION       OB History     Gravida  2   Para  2   Term      Preterm      AB      Living         SAB      IAB      Ectopic      Multiple      Live Births              No family history on file.  Social History   Tobacco Use   Smoking status: Never   Smokeless tobacco: Never  Vaping Use   Vaping Use: Never used  Substance Use Topics   Alcohol use: No   Drug use: No    Home Medications Prior to Admission medications   Medication Sig Start Date End Date Taking? Authorizing Provider  ibuprofen (ADVIL,MOTRIN) 600 MG tablet Take 1 tablet (600 mg total) by mouth every 6 (six) hours as needed. 07/08/18   Fayrene Helper, PA-C  levocetirizine (XYZAL) 5 MG tablet Take 1 tablet (5 mg total) by mouth at bedtime. 10/01/17   Harris, Cammy Copa, PA-C  naproxen (NAPROSYN) 375 MG tablet Take 1 tablet (375 mg total) by mouth 2 (two) times daily. 10/01/17   Harris, Cammy Copa, PA-C  sulfamethoxazole-trimethoprim  (BACTRIM DS,SEPTRA DS) 800-160 MG tablet Take 1 tablet by mouth 2 (two) times daily. Patient not taking: Reported on 08/05/2017 07/06/17   Kellie Shropshire, PA-C    Allergies    Penicillins  Review of Systems   Review of Systems  Physical Exam Updated Vital Signs BP (!) 106/93 (BP Location: Left Arm)    Pulse (!) 138    Temp (!) 101 F (38.3 C) (Oral)    Resp 20    Ht 5\' 3"  (1.6 m)    Wt 117.9 kg    LMP 06/19/2021    SpO2 98%    BMI 46.06 kg/m   Physical Exam  ED Results / Procedures / Treatments   Labs (all labs ordered are listed, but only abnormal results are displayed) Labs Reviewed  RESP PANEL BY RT-PCR (FLU A&B, COVID) [id number]    EKG None  Radiology No results found.  Procedures Procedures   Medications Ordered in ED Medications  ibuprofen (ADVIL) tablet 400 mg (400 mg Oral Given 07/02/21 1444)    ED Course  I have reviewed the triage vital signs and the nursing  notes.  Pertinent labs & imaging results that were available during my care of the patient were reviewed by me and considered in my medical decision making (see chart for details).    MDM Rules/Calculators/A&P                         {Remember to document critical care time when appropriate:1}  *** Final Clinical Impression(s) / ED Diagnoses Final diagnoses:  None    Rx / DC Orders ED Discharge Orders     None

## 2021-07-02 NOTE — ED Notes (Signed)
PA at bedside assessing pt at this time.

## 2021-07-02 NOTE — ED Triage Notes (Signed)
Abscess to her buttocks for a few days. No drainage.

## 2021-07-02 NOTE — ED Notes (Signed)
Lab called to add on blue top sent with previous labs.

## 2021-07-02 NOTE — ED Provider Notes (Addendum)
[facility] EMERGENCY DEPARTMENT Provider Note   CSN: [id number] Arrival date & time: 07/02/21  1425     History Chief Complaint  Patient presents with   Abscess    Shannon Velasquez is a 35 y.o. female with no significant medical history presents for complaint of abscess to her left intergluteal cleft since Friday. Patient reports she has attempted to alleviate pressure of abscess utilizing warm compresses with mild success. Patient has history of abscesses typically in her axillary area. Patient denies history of IV drug use. Patient endorses subjective fevers. Patient denies nausea, vomiting, diarrhea, abdominal pain.   Abscess Associated symptoms: fever   Associated symptoms: no nausea and no vomiting       Past Medical History:  Diagnosis Date   Epilepsy (HCC)    last seizure age 30 years   Epilepsy (HCC)     There are no problems to display for this patient.   Past Surgical History:  Procedure Laterality Date   BARTHOLIN CYST MARSUPIALIZATION       OB History     Gravida  2   Para  2   Term      Preterm      AB      Living         SAB      IAB      Ectopic      Multiple      Live Births              No family history on file.  Social History   Tobacco Use   Smoking status: Never   Smokeless tobacco: Never  Vaping Use   Vaping Use: Never used  Substance Use Topics   Alcohol use: No   Drug use: No    Home Medications Prior to Admission medications   Medication Sig Start Date End Date Taking? Authorizing Provider  clindamycin (CLEOCIN) 150 MG capsule Take 3 capsules (450 mg total) by mouth 3 (three) times daily for 7 days. 07/02/21 07/09/21 Yes Al Decant, PA-C  ibuprofen (ADVIL,MOTRIN) 600 MG tablet Take 1 tablet (600 mg total) by mouth every 6 (six) hours as needed. 07/08/18   Fayrene Helper, PA-C  levocetirizine (XYZAL) 5 MG tablet Take 1 tablet (5 mg total) by mouth at bedtime. 10/01/17   Harris, Cammy Copa, PA-C   naproxen (NAPROSYN) 375 MG tablet Take 1 tablet (375 mg total) by mouth 2 (two) times daily. 10/01/17   Harris, Cammy Copa, PA-C  sulfamethoxazole-trimethoprim (BACTRIM DS,SEPTRA DS) 800-160 MG tablet Take 1 tablet by mouth 2 (two) times daily. Patient not taking: Reported on 08/05/2017 07/06/17   Kellie Shropshire, PA-C    Allergies    Penicillins  Review of Systems   Review of Systems  Constitutional:  Positive for fever. Negative for chills.  Respiratory:  Negative for shortness of breath.   Cardiovascular:  Negative for chest pain.  Gastrointestinal:  Negative for abdominal pain, diarrhea, nausea and vomiting.  Neurological:  Negative for dizziness and light-headedness.  All other systems reviewed and are negative.  Physical Exam Updated Vital Signs BP (!) 92/58    Pulse 86    Temp 98 F (36.7 C)    Resp 20    Ht 5\' 3"  (1.6 m)    Wt 117.9 kg    LMP 06/19/2021    SpO2 100%    BMI 46.06 kg/m   Physical Exam Vitals and nursing note reviewed.  Constitutional:      General:  She is not in acute distress.    Appearance: She is not ill-appearing or toxic-appearing.  Cardiovascular:     Rate and Rhythm: Normal rate and regular rhythm.  Pulmonary:     Effort: Pulmonary effort is normal.     Breath sounds: Normal breath sounds. No wheezing.  Abdominal:     General: Abdomen is flat.     Palpations: Abdomen is soft.     Tenderness: There is no abdominal tenderness.  Skin:    General: Skin is warm and dry.     Capillary Refill: Capillary refill takes less than 2 seconds.     Findings: Abscess and erythema present.          Comments: 5cm non-draining abscess with surrounding induration and erythema   Neurological:     Mental Status: She is alert.    ED Results / Procedures / Treatments   Labs (all labs ordered are listed, but only abnormal results are displayed) Labs Reviewed  CBC WITH DIFFERENTIAL/PLATELET - Abnormal; Notable for the following components:      Result Value    MCV 77.0 (*)    MCH 25.9 (*)    All other components within normal limits  BASIC METABOLIC PANEL - Abnormal; Notable for the following components:   Sodium 134 (*)    Potassium 3.4 (*)    Glucose, Bld 110 (*)    Calcium 8.7 (*)    All other components within normal limits  RESP PANEL BY RT-PCR (FLU A&B, COVID) ARPGX2  CULTURE, BLOOD (ROUTINE X 2)  CULTURE, BLOOD (ROUTINE X 2)  LACTIC ACID, PLASMA  PROTIME-INR  APTT  PREGNANCY, URINE    EKG None  Radiology No results found.  Procedures .Marland KitchenIncision and Drainage  Date/Time: 07/02/2021 5:56 PM Performed by: Al Decant, PA-C Authorized by: Al Decant, PA-C   Consent:    Consent obtained:  Verbal   Consent given by:  Patient   Risks, benefits, and alternatives were discussed: yes     Risks discussed:  Bleeding, incomplete drainage, pain and infection   Alternatives discussed:  Observation Universal protocol:    Procedure explained and questions answered to patient or proxy's satisfaction: yes     Site/side marked: yes     Immediately prior to procedure, a time out was called: yes     Patient identity confirmed:  Verbally with patient and arm band Location:    Type:  Abscess   Size:  5cm   Location:  Anogenital   Anogenital location:  Gluteal cleft Pre-procedure details:    Skin preparation:  Povidone-iodine and chlorhexidine with alcohol Sedation:    Sedation type:  None Anesthesia:    Anesthesia method:  Local infiltration   Local anesthetic:  Lidocaine 2% WITH epi Procedure type:    Complexity:  Complex Procedure details:    Incision types:  Single straight   Incision depth:  Dermal   Wound management:  Probed and deloculated, irrigated with saline and extensive cleaning   Drainage:  Bloody and purulent   Drainage amount:  Copious   Wound treatment:  Wound left open   Packing materials:  None Post-procedure details:    Procedure completion:  Tolerated well, no immediate complications    Medications Ordered in ED Medications  ibuprofen (ADVIL) tablet 400 mg (400 mg Oral Given 07/02/21 1444)  sodium chloride 0.9 % bolus 1,000 mL (0 mLs Intravenous Stopped 07/02/21 1643)  lidocaine-EPINEPHrine (XYLOCAINE W/EPI) 2 %-1:200000 (PF) injection 10 mL (10 mLs Intradermal Given  by Other 07/02/21 1659)    ED Course  I have reviewed the triage vital signs and the nursing notes.  Pertinent labs & imaging results that were available during my care of the patient were reviewed by me and considered in my medical decision making (see chart for details).    MDM Rules/Calculators/A&P                          35 year old female presents to ED with left gluteal cleft abscess since Friday.  On examination, patient is febrile up to 101, hypotensive and tachycardic with a known source of infection in the form of an abscess.  Due to this I have initiated the sepsis order set.  I discussed this patient with Dr. Lynelle Doctor who is in agreement with sepsis order set.  Abscess drained per procedure note.  Patient CBC results within normal limits.  No elevated white blood cell count. Patient BMP results within normal limits. Patient lactic acid within normal limits. Patient pro time INR within normal limits.  At this time, patient sepsis work-up was negative. Patient fever has resolved and temperature of patient last measured at 72F. Patient tachycardia resolved at 86BPM as of last set of vitals. Patient BP still low after 1 liter of fluid but patient denies dizziness, lightheadness, weakness. I went back and reviewed vitals from past visits and patient does have history of low blood pressure with one being recorded 96/75 on 12/17/14 with a chief complaint of dental pain.   I feel comfortable discharging this patient at this time.  I discussed the patient's case with Dr. Lynelle Doctor who is also in agreement.  I explained to the patient that were she to develop nausea, vomiting, diarrhea, fevers, lightheadedness  or dizziness that she needs to return to the ED for further management.  Patient expresses understanding with these discharge instructions.  Patient does have signs of cellulitis surrounding her abscess so I have sent in a prescription for clindamycin due to her penicillin allergy.  I have told her that she can pick this up at her local pharmacy at her earliest convenience and counseled her on the side effects of clindamycin including GI upset.  I have advised this patient to change her dressing twice daily and if it begins to appear infected to return to the ED for wound check.  I have counseled this patient on the signs and symptoms of local infection.  Patient is in agreement with the plan for discharge.  This patient is stable for discharge at this time.   Final Clinical Impression(s) / ED Diagnoses Final diagnoses:  Abscess of gluteal cleft    Rx / DC Orders ED Discharge Orders          Ordered    clindamycin (CLEOCIN) 150 MG capsule  3 times daily        07/02/21 1935               Clent Ridges 07/03/21 3532    Linwood Dibbles, MD 07/03/21 1539

## 2021-07-02 NOTE — ED Notes (Signed)
Pt ambulated to BR with steady gait.

## 2021-07-02 NOTE — ED Notes (Signed)
Unable to obtain discharge signature. Signature pad not working.

## 2021-07-02 NOTE — ED Notes (Signed)
Discharge instructions including prescription and follow up care discussed with pt. Pt verbalized understanding with no questions at this time.  

## 2021-07-02 NOTE — ED Notes (Signed)
PA at bedside performing I&D

## 2021-07-02 NOTE — ED Notes (Signed)
Pt HR increasing to 140s while moving around in bed. PA notified; will continue to monitor.

## 2021-07-02 NOTE — Discharge Instructions (Addendum)
Return to the ED with any new or worsening symptoms such as fevers, nausea, vomiting, diarrhea You may pick up your antibiotics as discussed at the pharmacy.  Please begin taking these immediately Please attempt to change the dressing over your abscess twice a day.  If you feel the abscess is becoming infected please come back to the ED for wound check

## 2021-07-02 NOTE — ED Notes (Signed)
PT refused to lay on back to take BP due to abscess location

## 2021-07-07 LAB — CULTURE, BLOOD (ROUTINE X 2)
Culture: NO GROWTH
Culture: NO GROWTH
Special Requests: ADEQUATE
Special Requests: ADEQUATE

## 2021-10-16 ENCOUNTER — Other Ambulatory Visit: Payer: Self-pay

## 2021-10-16 ENCOUNTER — Emergency Department (HOSPITAL_BASED_OUTPATIENT_CLINIC_OR_DEPARTMENT_OTHER)
Admission: EM | Admit: 2021-10-16 | Discharge: 2021-10-16 | Disposition: A | Discharge status: Home / Self Care | Payer: Medicaid Other | Attending: Emergency Medicine | Admitting: Emergency Medicine

## 2021-10-16 ENCOUNTER — Encounter (HOSPITAL_BASED_OUTPATIENT_CLINIC_OR_DEPARTMENT_OTHER): Payer: Self-pay | Admitting: *Deleted

## 2021-10-16 DIAGNOSIS — R42 Dizziness and giddiness: Secondary | ICD-10-CM | POA: Diagnosis not present

## 2021-10-16 DIAGNOSIS — R6889 Other general symptoms and signs: Secondary | ICD-10-CM

## 2021-10-16 DIAGNOSIS — Z20822 Contact with and (suspected) exposure to covid-19: Secondary | ICD-10-CM | POA: Diagnosis not present

## 2021-10-16 DIAGNOSIS — J020 Streptococcal pharyngitis: Secondary | ICD-10-CM | POA: Diagnosis not present

## 2021-10-16 DIAGNOSIS — J029 Acute pharyngitis, unspecified: Secondary | ICD-10-CM | POA: Diagnosis present

## 2021-10-16 LAB — GROUP A STREP BY PCR: Group A Strep by PCR: DETECTED — AB

## 2021-10-16 LAB — RESP PANEL BY RT-PCR (FLU A&B, COVID) ARPGX2
Influenza A by PCR: NEGATIVE
Influenza B by PCR: NEGATIVE
SARS Coronavirus 2 by RT PCR: NEGATIVE

## 2021-10-16 MED ORDER — AZITHROMYCIN 250 MG PO TABS: 250.0000 mg | ORAL_TABLET | Freq: Every day | ORAL | 0 refills | Status: DC

## 2021-10-16 NOTE — ED Triage Notes (Signed)
Here for sore throat, headaches and has a runny nose. Onset was two days ago, states has tried OTC meds without relief ?

## 2021-10-16 NOTE — ED Provider Notes (Addendum)
?[facility] EMERGENCY DEPARTMENT ?Provider Note ? ? ?CSN: [id number] ?Arrival date & time: 10/16/21  8250 ? ?  ? ?History ? ?Chief Complaint  ?Patient presents with  ? Sore Throat  ? ? ?Shannon Velasquez is a 36 y.o. female. ? ?Patient with onset of flulike symptoms on Monday evening.  Complaint sore throat headache fever lightheadedness cough congestion no nausea vomiting or diarrhea.  No past history of strep pharyngitis.  Patient without any known flu or COVID exposure. ? ?Past medical history significant for history of epilepsy last seizure was at age 41.  Patient not on any antiseizure medicine. ? ? ?  ? ?Home Medications ?Prior to Admission medications   ?Medication Sig Start Date End Date Taking? Authorizing Provider  ?ibuprofen (ADVIL,MOTRIN) 600 MG tablet Take 1 tablet (600 mg total) by mouth every 6 (six) hours as needed. 07/08/18   Fayrene Helper, PA-C  ?levocetirizine (XYZAL) 5 MG tablet Take 1 tablet (5 mg total) by mouth at bedtime. 10/01/17   Arthor Captain, PA-C  ?naproxen (NAPROSYN) 375 MG tablet Take 1 tablet (375 mg total) by mouth 2 (two) times daily. 10/01/17   Arthor Captain, PA-C  ?sulfamethoxazole-trimethoprim (BACTRIM DS,SEPTRA DS) 800-160 MG tablet Take 1 tablet by mouth 2 (two) times daily. ?Patient not taking: Reported on 08/05/2017 07/06/17   Kellie Shropshire, PA-C  ?   ? ?Allergies    ?Penicillins   ? ?Review of Systems   ?Review of Systems  ?Constitutional:  Positive for fever. Negative for chills.  ?HENT:  Positive for congestion and sore throat. Negative for ear pain.   ?Eyes:  Negative for pain and visual disturbance.  ?Respiratory:  Positive for cough. Negative for shortness of breath.   ?Cardiovascular:  Negative for chest pain and palpitations.  ?Gastrointestinal:  Negative for abdominal pain and vomiting.  ?Genitourinary:  Negative for dysuria and hematuria.  ?Musculoskeletal:  Negative for arthralgias and back pain.  ?Skin:  Negative for color change and rash.   ?Neurological:  Positive for light-headedness and headaches. Negative for seizures and syncope.  ?All other systems reviewed and are negative. ? ?Physical Exam ?Updated Vital Signs ?BP (!) 117/97   Pulse 94   Temp 98.5 ?F (36.9 ?C) (Oral)   Resp 17   Ht 1.6 m (5\' 3" )   Wt 99.8 kg   SpO2 100%   BMI 38.97 kg/m?  ?Physical Exam ?Vitals and nursing note reviewed.  ?Constitutional:   ?   General: She is not in acute distress. ?   Appearance: Normal appearance. She is well-developed.  ?HENT:  ?   Head: Normocephalic and atraumatic.  ?   Mouth/Throat:  ?   Mouth: Mucous membranes are moist.  ?   Pharynx: Oropharyngeal exudate and posterior oropharyngeal erythema present.  ?   Comments: Uvula midline.  Bilateral tonsillar exudate.  With erythema. ?Eyes:  ?   Extraocular Movements: Extraocular movements intact.  ?   Conjunctiva/sclera: Conjunctivae normal.  ?   Pupils: Pupils are equal, round, and reactive to light.  ?Cardiovascular:  ?   Rate and Rhythm: Normal rate and regular rhythm.  ?   Heart sounds: No murmur heard. ?Pulmonary:  ?   Effort: Pulmonary effort is normal. No respiratory distress.  ?   Breath sounds: Normal breath sounds. No wheezing, rhonchi or rales.  ?Abdominal:  ?   Palpations: Abdomen is soft.  ?   Tenderness: There is no abdominal tenderness.  ?Musculoskeletal:     ?   General: No swelling.  ?  Cervical back: Normal range of motion and neck supple.  ?Skin: ?   General: Skin is warm and dry.  ?   Capillary Refill: Capillary refill takes less than 2 seconds.  ?Neurological:  ?   General: No focal deficit present.  ?   Mental Status: She is alert and oriented to person, place, and time.  ?   Cranial Nerves: No cranial nerve deficit.  ?   Sensory: No sensory deficit.  ?Psychiatric:     ?   Mood and Affect: Mood normal.  ? ? ?ED Results / Procedures / Treatments   ?Labs ?(all labs ordered are listed, but only abnormal results are displayed) ?Labs Reviewed  ?GROUP A STREP BY PCR - Abnormal; Notable  for the following components:  ?    Result Value  ? Group A Strep by PCR DETECTED (*)   ? All other components within normal limits  ?RESP PANEL BY RT-PCR (FLU A&B, COVID) ARPGX2  ? ? ?EKG ?None ? ?Radiology ?No results found. ? ?Procedures ?Procedures  ? ? ?Medications Ordered in ED ?Medications - No data to display ? ?ED Course/ Medical Decision Making/ A&P ?  ?                        ?Medical Decision Making ?Risk ?Prescription drug management. ? ? ?Patient's throat concerning for possible strep pharyngitis.  Will check for strep.  Patient also wants to be checked for COVID and influenza.  Patient nontoxic no acute distress.  No fever here.  Vital signs heart rate little tachycardic at 104.  Blood pressure is good and respirations are good.  Lungs are clear bilaterally.  Not clinically concerned about pneumonia.  Chest x-ray not indicated at this time. ? ?Strep positive.  Will treat with Zithromax Z-Pak.  Since patient has penicillin allergy.  Patient was a prescription printed. ? ?Work note provided.  Return for any new or worse symptoms.  Would expect improvement over the next couple days. ? ?Patient will follow-up on the COVID and flu testing on MyChart.  But does not meet any criteria for admission if those were to be positive.  Would be symptomatic treatment. ? ? ?Final Clinical Impression(s) / ED Diagnoses ?Final diagnoses:  ?Flu-like symptoms  ?Strep pharyngitis  ? ? ?Rx / DC Orders ?ED Discharge Orders   ? ? None  ? ?  ? ? ?  ?Vanetta Mulders, MD ?10/16/21 [phone] ? ?  ?Vanetta Mulders, MD ?10/16/21 1036 ? ?  ?Vanetta Mulders, MD ?10/16/21 1037 ? ?

## 2021-10-16 NOTE — Discharge Instructions (Signed)
Take the Zithromax as directed over the next several days.  Work note provided.  Can take Motrin for sore throat pain.  Would expect improvement over couple days.  Return for any new or worse symptoms.  Follow-up on MyChart about your COVID and flu testing. ?

## 2022-04-02 ENCOUNTER — Ambulatory Visit: Payer: Self-pay | Admitting: Surgery

## 2022-04-02 NOTE — H&P (View-Only) (Signed)
 Shannon Velasquez [id number]   Referring Provider:  Tann, Samandra, NP   Subjective   Chief Complaint: New Consultation (Axillary Hidradenitis )     History of Present Illness:    Pleasant 36-year-old woman with hidradenitis.  This is in bilateral axilla and she has been dealing with this since about the age of 14.  She has had this lanced multiple times but otherwise no surgical intervention.  She has been given antibiotics but has otherwise not had medical treatment.  The right side in particular is bothersome, it flares up frequently and is quite painful.  She is interested in surgical options to treat this.   Review of Systems: A complete review of systems was obtained from the patient.  I have reviewed this information and discussed as appropriate with the patient.  See HPI as well for other ROS.   Medical History: Past Medical History:  Diagnosis Date   Seizures (CMS-HCC)     There is no problem list on file for this patient.   Past Surgical History:  Procedure Laterality Date   Left Labia Removed        Allergies  Allergen Reactions   Penicillins Other (See Comments)    From when younger  Has patient had a PCN reaction causing immediate rash, facial/tongue/throat swelling, SOB or lightheadedness with hypotension: No  Has patient had a PCN reaction causing severe rash involving mucus membranes or skin necrosis: No  Has patient had a PCN reaction that required hospitalization: no  Has patient had a PCN reaction occurring within the last 10 years: no  If all of the above answers are "NO", then may proceed with Cephalosporin use.  Other reaction(s): UNKNOWN REACTION    No current outpatient medications on file prior to visit.   No current facility-administered medications on file prior to visit.    History reviewed. No pertinent family history.   Social History   Tobacco Use  Smoking Status Never  Smokeless Tobacco Never     Social History    Socioeconomic History   Marital status: Single  Tobacco Use   Smoking status: Never   Smokeless tobacco: Never  Substance and Sexual Activity   Alcohol use: Not Currently   Drug use: Never    Objective:    Vitals:   04/02/22 0942  BP: 130/70  Pulse: (!) 128  Temp: 36.8 C (98.3 F)  SpO2: 96%  Weight: (!) 114.1 kg (251 lb 9.6 oz)  Height: 160 cm (5' 3")    Body mass index is 44.57 kg/m.  Alert and well-appearing Unlabored respirations In the right axilla is an area approximately 8 x 3 cm with chronic induration and scarring with underlying sinus tracts, minimal active drainage.  Mild surrounding scarring without additional sinus tracts.  On the left side, there are a few small scars but no chronic sinus tracts or active drainage.  Assessment and Plan:  Diagnoses and all orders for this visit:  Hidradenitis axillaris   Discussed the natural history/ pathophysiology of this disease. Discussed the gamut of management options including: -Smoking cessation -No shave, no chemical or perfumed soaps or deodorants -Keep area clean and dry as possible, wear breathable clothing -Brewer's yeast free diet -Topical treatments including antibiotic ointment (clindamycin) with or without retinoin -Suppressive oral antibiotics -Dermatology referral to discuss immunomodulation therapy -and finally surgery which ranges from local unroofing to wide excision with rotational flap coverage would be the only way to ensure no further flares, we discussed that this is   a large surgery requiring several weeks of wound healing, with risk of bleeding, infection, pain, scarring and nearly 100% issue with wound healing. Discussed that would only do one side at a time.  In her case, I do think it would be reasonable to proceed with local excision of the most severe area of scarring in the right axilla.  We discussed that this would be done under anesthesia and there is a high rate of wound healing  problems and that this would not prevent further episodes of hidradenitis in other areas of her body.  We discussed postoperative activity limitations in general expected postoperative course.  Questions welcomed and answered to her satisfaction.  She would like to proceed with surgery.    Kaidyn Hernandes [name], MD

## 2022-04-02 NOTE — H&P (Signed)
[facility] [id number]   Referring Provider:  Levonne Lapping, NP   Subjective   Chief Complaint: New Consultation (Axillary Hidradenitis )     History of Present Illness:    Pleasant 36 year old woman with hidradenitis.  This is in bilateral axilla and she has been dealing with this since about the age of 75.  She has had this lanced multiple times but otherwise no surgical intervention.  She has been given antibiotics but has otherwise not had medical treatment.  The right side in particular is bothersome, it flares up frequently and is quite painful.  She is interested in surgical options to treat this.   Review of Systems: A complete review of systems was obtained from the patient.  I have reviewed this information and discussed as appropriate with the patient.  See HPI as well for other ROS.   Medical History: Past Medical History:  Diagnosis Date   Seizures (CMS-HCC)     There is no problem list on file for this patient.   Past Surgical History:  Procedure Laterality Date   Left Labia Removed        Allergies  Allergen Reactions   Penicillins Other (See Comments)    From when younger  Has patient had a PCN reaction causing immediate rash, facial/tongue/throat swelling, SOB or lightheadedness with hypotension: No  Has patient had a PCN reaction causing severe rash involving mucus membranes or skin necrosis: No  Has patient had a PCN reaction that required hospitalization: no  Has patient had a PCN reaction occurring within the last 10 years: no  If all of the above answers are "NO", then may proceed with Cephalosporin use.  Other reaction(s): UNKNOWN REACTION    No current outpatient medications on file prior to visit.   No current facility-administered medications on file prior to visit.    History reviewed. No pertinent family history.   Social History   Tobacco Use  Smoking Status Never  Smokeless Tobacco Never     Social History    Socioeconomic History   Marital status: Single  Tobacco Use   Smoking status: Never   Smokeless tobacco: Never  Substance and Sexual Activity   Alcohol use: Not Currently   Drug use: Never    Objective:    Vitals:   04/02/22 0942  BP: 130/70  Pulse: (!) 128  Temp: 36.8 C (98.3 F)  SpO2: 96%  Weight: (!) 114.1 kg (251 lb 9.6 oz)  Height: 160 cm (5\' 3" )    Body mass index is 44.57 kg/m.  Alert and well-appearing Unlabored respirations In the right axilla is an area approximately 8 x 3 cm with chronic induration and scarring with underlying sinus tracts, minimal active drainage.  Mild surrounding scarring without additional sinus tracts.  On the left side, there are a few small scars but no chronic sinus tracts or active drainage.  Assessment and Plan:  Diagnoses and all orders for this visit:  Hidradenitis axillaris   Discussed the natural history/ pathophysiology of this disease. Discussed the gamut of management options including: -Smoking cessation -No shave, no chemical or perfumed soaps or deodorants -Keep area clean and dry as possible, wear breathable clothing -Brewer's yeast free diet -Topical treatments including antibiotic ointment (clindamycin) with or without retinoin -Suppressive oral antibiotics -Dermatology referral to discuss immunomodulation therapy -and finally surgery which ranges from local unroofing to wide excision with rotational flap coverage would be the only way to ensure no further flares, we discussed that this is  a large surgery requiring several weeks of wound healing, with risk of bleeding, infection, pain, scarring and nearly 100% issue with wound healing. Discussed that would only do one side at a time.  In her case, I do think it would be reasonable to proceed with local excision of the most severe area of scarring in the right axilla.  We discussed that this would be done under anesthesia and there is a high rate of wound healing  problems and that this would not prevent further episodes of hidradenitis in other areas of her body.  We discussed postoperative activity limitations in general expected postoperative course.  Questions welcomed and answered to her satisfaction.  She would like to proceed with surgery.    [place] Alexa [name], MD

## 2022-04-16 NOTE — Patient Instructions (Addendum)
DUE TO COVID-19 ONLY TWO VISITORS  (aged 35 and older)  ARE ALLOWED TO COME WITH YOU AND STAY IN THE WAITING ROOM ONLY DURING PRE OP AND PROCEDURE.   **NO VISITORS ARE ALLOWED IN THE SHORT STAY AREA OR RECOVERY ROOM!!**  IF YOU WILL BE ADMITTED INTO THE HOSPITAL YOU ARE ALLOWED ONLY FOUR SUPPORT PEOPLE DURING VISITATION HOURS ONLY (7 AM -8PM)   The support person(s) must pass our screening, gel in and out, and wear a mask at all times, including in the patient's room. Patients must also wear a mask when staff or their support person are in the room. Visitors GUEST BADGE MUST BE WORN VISIBLY  One adult visitor may remain with you overnight and MUST be in the room by 8 P.M.     Your procedure is scheduled on: 04/28/22   Report to [facility] Main Entrance    Report to admitting at : 5:30 AM   Call this number if you have problems the morning of surgery [phone]   Do not eat food :After [place] is also important to reduce your risk of infection.                                    Remember - BRUSH YOUR TEETH THE MORNING OF SURGERY WITH YOUR REGULAR TOOTHPASTE   Do NOT smoke after Midnight   Take these medicines the morning of surgery with A SIP OF WATER: N/A  DO NOT TAKE ANY ORAL DIABETIC MEDICATIONS DAY OF YOUR SURGERY  Bring CPAP mask and tubing day of surgery.                              You may not have any metal on your body including hair pins, jewelry, and body piercing             Do not wear make-up, lotions, powders, perfumes/cologne, or deodorant  Do not wear nail polish including gel and S&S, artificial/acrylic nails, or any other type of covering on natural nails including finger and toenails. If you have artificial nails, gel coating, etc. that needs to be removed by a nail salon please have this removed prior to surgery or surgery may need to be canceled/ delayed if the surgeon/ anesthesia feels like they are unable to be safely monitored.    Do not shave  48 hours prior to surgery. .   Do not bring valuables to the hospital. [place].   Contacts, dentures or bridgework may not be worn into surgery.   Bring small overnight bag day of surgery.   DO NOT [place]. PHARMACY WILL DISPENSE MEDICATIONS LISTED ON YOUR MEDICATION LIST TO YOU DURING YOUR ADMISSION [place]!    Patients discharged on the day of surgery will not be allowed to drive home.  Someone NEEDS to stay with you for the first 24 hours after anesthesia.   Special Instructions: Bring a copy of your healthcare power of attorney and living will documents         the day of surgery if you haven't scanned them before.              Please read over the  following fact sheets you were given: IF YOU HAVE QUESTIONS ABOUT YOUR PRE-OP INSTRUCTIONS PLEASE CALL [phone]     [facility] - Preparing for Surgery Before surgery, you can play an important role.  Because skin is not sterile, your skin needs to be as free of germs as possible.  You can reduce the number of germs on your skin by washing with CHG (chlorahexidine gluconate) soap before surgery.  CHG is an antiseptic cleaner which kills germs and bonds with the skin to continue killing germs even after washing. Please DO NOT use if you have an allergy to CHG or antibacterial soaps.  If your skin becomes reddened/irritated stop using the CHG and inform your nurse when you arrive at Short Stay. Do not shave (including legs and underarms) for at least 48 hours prior to the first CHG shower.  You may shave your face/neck. Please follow these instructions carefully:  1.  Shower with CHG Soap the night before surgery and the  morning of Surgery.  2.  If you choose to wash your hair, wash your hair first as usual with your  normal  shampoo.  3.  After you shampoo, rinse your hair and body thoroughly to remove the  shampoo.                            4.  Use CHG as you would any other liquid soap.  You can apply chg directly  to the skin and wash                       Gently with a scrungie or clean washcloth.  5.  Apply the CHG Soap to your body ONLY FROM THE NECK DOWN.   Do not use on face/ open                           Wound or open sores. Avoid contact with eyes, ears mouth and genitals (private parts).                       Wash face,  Genitals (private parts) with your normal soap.             6.  Wash thoroughly, paying special attention to the area where your surgery  will be performed.  7.  Thoroughly rinse your body with warm water from the neck down.  8.  DO NOT shower/wash with your normal soap after using and rinsing off  the CHG Soap.                9.  Pat yourself dry with a clean towel.            10.  Wear clean pajamas.            11.  Place clean sheets on your bed the night of your first shower and do not  sleep with pets. Day of Surgery : Do not apply any lotions/deodorants the morning of surgery.  Please wear clean clothes to the hospital/surgery center.  FAILURE TO FOLLOW THESE INSTRUCTIONS MAY RESULT IN THE CANCELLATION OF YOUR SURGERY PATIENT SIGNATURE_________________________________  NURSE SIGNATURE__________________________________  ________________________________________________________________________

## 2022-04-17 ENCOUNTER — Encounter (HOSPITAL_COMMUNITY)
Admission: RE | Admit: 2022-04-17 | Discharge: 2022-04-17 | Disposition: A | Discharge status: Home / Self Care | Payer: Medicaid Other | Source: Ambulatory Visit | Attending: Surgery | Admitting: Surgery

## 2022-04-17 ENCOUNTER — Encounter (HOSPITAL_COMMUNITY): Payer: Self-pay

## 2022-04-17 ENCOUNTER — Other Ambulatory Visit: Payer: Self-pay

## 2022-04-17 VITALS — BP 108/71 | HR 80 | Temp 98.0°F | Ht 63.5 in | Wt 254.0 lb

## 2022-04-17 DIAGNOSIS — Z01812 Encounter for preprocedural laboratory examination: Secondary | ICD-10-CM | POA: Diagnosis not present

## 2022-04-17 DIAGNOSIS — Z01818 Encounter for other preprocedural examination: Secondary | ICD-10-CM

## 2022-04-17 LAB — CBC
HCT: 40.9 % (ref 36.0–46.0)
Hemoglobin: 13.3 g/dL (ref 12.0–15.0)
MCH: 26.4 pg (ref 26.0–34.0)
MCHC: 32.5 g/dL (ref 30.0–36.0)
MCV: 81.3 fL (ref 80.0–100.0)
Platelets: 325 10*3/uL (ref 150–400)
RBC: 5.03 MIL/uL (ref 3.87–5.11)
RDW: 16.4 % — ABNORMAL HIGH (ref 11.5–15.5)
WBC: 5.8 10*3/uL (ref 4.0–10.5)
nRBC: 0 % (ref 0.0–0.2)

## 2022-04-17 NOTE — Progress Notes (Addendum)
For Short Stay: [place] appointment date: Date of COVID positive in last 52 days:  Bowel Prep reminder:   For Anesthesia: PCP - Dianna Rossetti: NP Cardiologist -   Chest x-ray -  EKG -  Stress Test -  ECHO -  Cardiac Cath -  Pacemaker/ICD device last checked: Pacemaker orders received: Device Rep notified:  Spinal Cord Stimulator:  Sleep Study -  CPAP -   Fasting Blood Sugar -  Checks Blood Sugar _____ times a day Date and result of last Hgb A1c-  Blood Thinner Instructions: Aspirin Instructions: Last Dose:  Activity level: Can go up a flight of stairs and activities of daily living without stopping and without chest pain and/or shortness of breath   Able to exercise without chest pain and/or shortness of breath   Unable to go up a flight of stairs without chest pain and/or shortness of breath     Anesthesia review: Hx: Seizure(last one at age 36)  Patient denies shortness of breath, fever, cough and chest pain at PAT appointment   Patient verbalized understanding of instructions that were given to them at the PAT appointment. Patient was also instructed that they will need to review over the PAT instructions again at home before surgery.

## 2022-04-25 ENCOUNTER — Encounter (HOSPITAL_COMMUNITY): Payer: Self-pay | Admitting: Surgery

## 2022-04-27 NOTE — Anesthesia Preprocedure Evaluation (Signed)
Anesthesia Evaluation  Patient identified by MRN, date of birth, ID band Patient awake    Reviewed: Allergy & Precautions, NPO status , Patient's Chart, lab work & pertinent test results  Airway Mallampati: III  TM Distance: >3 FB Neck ROM: Full    Dental  (+) Dental Advisory Given, Missing   Pulmonary neg pulmonary ROS,    Pulmonary exam normal breath sounds clear to auscultation       Cardiovascular negative cardio ROS Normal cardiovascular exam Rhythm:Regular Rate:Normal     Neuro/Psych Seizures -, Well Controlled,  negative psych ROS   GI/Hepatic negative GI ROS, Neg liver ROS,   Endo/Other  Morbid obesity  Renal/GU negative Renal ROS  negative genitourinary   Musculoskeletal negative musculoskeletal ROS (+)   Abdominal   Peds negative pediatric ROS (+)  Hematology negative hematology ROS (+)   Anesthesia Other Findings   Reproductive/Obstetrics negative OB ROS                            Anesthesia Physical Anesthesia Plan  ASA: 3  Anesthesia Plan: General   Post-op Pain Management: Tylenol PO (pre-op)* and Toradol IV (intra-op)*   Induction: Intravenous  PONV Risk Score and Plan: 3 and Midazolam, Dexamethasone and Ondansetron  Airway Management Planned: Oral ETT  Additional Equipment:   Intra-op Plan:   Post-operative Plan: Extubation in OR  Informed Consent: I have reviewed the patients History and Physical, chart, labs and discussed the procedure including the risks, benefits and alternatives for the proposed anesthesia with the patient or authorized representative who has indicated his/her understanding and acceptance.     Dental advisory given  Plan Discussed with: CRNA  Anesthesia Plan Comments:        Anesthesia Quick Evaluation

## 2022-04-28 ENCOUNTER — Other Ambulatory Visit: Payer: Self-pay

## 2022-04-28 ENCOUNTER — Encounter (HOSPITAL_COMMUNITY): Payer: Self-pay | Admitting: Surgery

## 2022-04-28 ENCOUNTER — Ambulatory Visit (HOSPITAL_BASED_OUTPATIENT_CLINIC_OR_DEPARTMENT_OTHER): Payer: Medicaid Other | Admitting: Anesthesiology

## 2022-04-28 ENCOUNTER — Ambulatory Visit (HOSPITAL_COMMUNITY)
Admission: RE | Admit: 2022-04-28 | Discharge: 2022-04-28 | Disposition: A | Discharge status: Home / Self Care | Payer: Medicaid Other | Attending: Surgery | Admitting: Surgery

## 2022-04-28 ENCOUNTER — Ambulatory Visit (HOSPITAL_COMMUNITY): Payer: Medicaid Other | Admitting: Anesthesiology

## 2022-04-28 ENCOUNTER — Encounter (HOSPITAL_COMMUNITY)
Admission: RE | Disposition: A | Discharge status: Home / Self Care | Payer: Self-pay | Source: Home / Self Care | Attending: Surgery

## 2022-04-28 DIAGNOSIS — Z6841 Body Mass Index (BMI) 40.0 and over, adult: Secondary | ICD-10-CM | POA: Diagnosis not present

## 2022-04-28 DIAGNOSIS — R569 Unspecified convulsions: Secondary | ICD-10-CM | POA: Diagnosis not present

## 2022-04-28 DIAGNOSIS — L732 Hidradenitis suppurativa: Secondary | ICD-10-CM | POA: Insufficient documentation

## 2022-04-28 DIAGNOSIS — Z01818 Encounter for other preprocedural examination: Secondary | ICD-10-CM

## 2022-04-28 HISTORY — PX: HYDRADENITIS EXCISION: SHX5243

## 2022-04-28 LAB — POCT PREGNANCY, URINE: Preg Test, Ur: NEGATIVE

## 2022-04-28 SURGERY — EXCISION, HIDRADENITIS, AXILLA
Anesthesia: General | Laterality: Right

## 2022-04-28 MED ORDER — ROCURONIUM BROMIDE 10 MG/ML (PF) SYRINGE
PREFILLED_SYRINGE | INTRAVENOUS | Status: AC
  Filled 2022-04-28: qty 10

## 2022-04-28 MED ORDER — FENTANYL CITRATE PF 50 MCG/ML IJ SOSY: 25.0000 ug | PREFILLED_SYRINGE | INTRAMUSCULAR | Status: DC | PRN

## 2022-04-28 MED ORDER — LACTATED RINGERS IV SOLN: INTRAVENOUS | Status: DC

## 2022-04-28 MED ORDER — PROPOFOL 10 MG/ML IV BOLUS
INTRAVENOUS | Status: AC
  Filled 2022-04-28: qty 20

## 2022-04-28 MED ORDER — CHLORHEXIDINE GLUCONATE 0.12 % MT SOLN
15.0000 mL | Freq: Once | OROMUCOSAL | Status: AC
  Administered 2022-04-28: 15 mL via OROMUCOSAL

## 2022-04-28 MED ORDER — GABAPENTIN 300 MG PO CAPS
300.0000 mg | ORAL_CAPSULE | ORAL | Status: AC
  Administered 2022-04-28: 300 mg via ORAL
  Filled 2022-04-28: qty 1

## 2022-04-28 MED ORDER — DOCUSATE SODIUM 100 MG PO CAPS: 100.0000 mg | ORAL_CAPSULE | Freq: Two times a day (BID) | ORAL | 0 refills | Status: AC

## 2022-04-28 MED ORDER — PROPOFOL 10 MG/ML IV BOLUS
INTRAVENOUS | Status: DC | PRN
  Administered 2022-04-28: 140 mg via INTRAVENOUS

## 2022-04-28 MED ORDER — DEXAMETHASONE SODIUM PHOSPHATE 10 MG/ML IJ SOLN
INTRAMUSCULAR | Status: DC | PRN
  Administered 2022-04-28: 10 mg via INTRAVENOUS

## 2022-04-28 MED ORDER — SODIUM CHLORIDE 0.9 % IV SOLN: 250.0000 mL | INTRAVENOUS | Status: DC | PRN

## 2022-04-28 MED ORDER — PROMETHAZINE HCL 25 MG/ML IJ SOLN: 6.2500 mg | INTRAMUSCULAR | Status: DC | PRN

## 2022-04-28 MED ORDER — ONDANSETRON HCL 4 MG/2ML IJ SOLN
INTRAMUSCULAR | Status: DC | PRN
  Administered 2022-04-28: 4 mg via INTRAVENOUS

## 2022-04-28 MED ORDER — BUPIVACAINE-EPINEPHRINE (PF) 0.25% -1:200000 IJ SOLN
INTRAMUSCULAR | Status: DC | PRN
  Administered 2022-04-28: 30 mL

## 2022-04-28 MED ORDER — LIDOCAINE 2% (20 MG/ML) 5 ML SYRINGE
INTRAMUSCULAR | Status: DC | PRN
  Administered 2022-04-28: 80 mg via INTRAVENOUS

## 2022-04-28 MED ORDER — CHLORHEXIDINE GLUCONATE 4 % EX LIQD: 60.0000 mL | Freq: Once | CUTANEOUS | Status: DC

## 2022-04-28 MED ORDER — BUPIVACAINE-EPINEPHRINE (PF) 0.25% -1:200000 IJ SOLN
INTRAMUSCULAR | Status: AC
  Filled 2022-04-28: qty 30

## 2022-04-28 MED ORDER — CEFAZOLIN SODIUM-DEXTROSE 2-4 GM/100ML-% IV SOLN
2.0000 g | INTRAVENOUS | Status: AC
  Administered 2022-04-28: 2 g via INTRAVENOUS
  Filled 2022-04-28: qty 100

## 2022-04-28 MED ORDER — ONDANSETRON HCL 4 MG/2ML IJ SOLN
INTRAMUSCULAR | Status: AC
  Filled 2022-04-28: qty 2

## 2022-04-28 MED ORDER — OXYCODONE HCL 5 MG PO TABS: 5.0000 mg | ORAL_TABLET | ORAL | Status: DC | PRN

## 2022-04-28 MED ORDER — KETOROLAC TROMETHAMINE 30 MG/ML IJ SOLN
INTRAMUSCULAR | Status: AC
  Filled 2022-04-28: qty 1

## 2022-04-28 MED ORDER — MIDAZOLAM HCL 2 MG/2ML IJ SOLN
INTRAMUSCULAR | Status: AC
  Filled 2022-04-28: qty 2

## 2022-04-28 MED ORDER — FENTANYL CITRATE (PF) 250 MCG/5ML IJ SOLN
INTRAMUSCULAR | Status: AC
  Filled 2022-04-28: qty 5

## 2022-04-28 MED ORDER — OXYCODONE HCL 5 MG PO TABS: 5.0000 mg | ORAL_TABLET | Freq: Three times a day (TID) | ORAL | 0 refills | Status: AC | PRN

## 2022-04-28 MED ORDER — SUGAMMADEX SODIUM 200 MG/2ML IV SOLN
INTRAVENOUS | Status: DC | PRN
  Administered 2022-04-28: 230 mg via INTRAVENOUS

## 2022-04-28 MED ORDER — MIDAZOLAM HCL 5 MG/5ML IJ SOLN
INTRAMUSCULAR | Status: DC | PRN
  Administered 2022-04-28: 2 mg via INTRAVENOUS

## 2022-04-28 MED ORDER — DEXAMETHASONE SODIUM PHOSPHATE 10 MG/ML IJ SOLN
INTRAMUSCULAR | Status: AC
  Filled 2022-04-28: qty 1

## 2022-04-28 MED ORDER — ORAL CARE MOUTH RINSE: 15.0000 mL | Freq: Once | OROMUCOSAL | Status: AC

## 2022-04-28 MED ORDER — PHENYLEPHRINE HCL (PRESSORS) 10 MG/ML IV SOLN
INTRAVENOUS | Status: AC
  Filled 2022-04-28: qty 1

## 2022-04-28 MED ORDER — ACETAMINOPHEN 650 MG RE SUPP: 650.0000 mg | RECTAL | Status: DC | PRN

## 2022-04-28 MED ORDER — FENTANYL CITRATE (PF) 250 MCG/5ML IJ SOLN
INTRAMUSCULAR | Status: DC | PRN
  Administered 2022-04-28: 100 ug via INTRAVENOUS

## 2022-04-28 MED ORDER — SODIUM CHLORIDE 0.9% FLUSH: 3.0000 mL | INTRAVENOUS | Status: DC | PRN

## 2022-04-28 MED ORDER — ACETAMINOPHEN 500 MG PO TABS
1000.0000 mg | ORAL_TABLET | ORAL | Status: AC
  Administered 2022-04-28: 1000 mg via ORAL
  Filled 2022-04-28: qty 2

## 2022-04-28 MED ORDER — KETOROLAC TROMETHAMINE 30 MG/ML IJ SOLN
INTRAMUSCULAR | Status: DC | PRN
  Administered 2022-04-28: 30 mg via INTRAVENOUS

## 2022-04-28 MED ORDER — ACETAMINOPHEN 325 MG PO TABS: 650.0000 mg | ORAL_TABLET | ORAL | Status: DC | PRN

## 2022-04-28 MED ORDER — ROCURONIUM BROMIDE 10 MG/ML (PF) SYRINGE
PREFILLED_SYRINGE | INTRAVENOUS | Status: DC | PRN
  Administered 2022-04-28: 50 mg via INTRAVENOUS

## 2022-04-28 MED ORDER — SODIUM CHLORIDE 0.9% FLUSH: 3.0000 mL | Freq: Two times a day (BID) | INTRAVENOUS | Status: DC

## 2022-04-28 SURGICAL SUPPLY — 34 items
APL PRP STRL LF DISP 70% ISPRP (MISCELLANEOUS) ×1
BAG COUNTER SPONGE SURGICOUNT (BAG) IMPLANT
BAG SPNG CNTER NS LX DISP (BAG)
BINDER BREAST LRG (GAUZE/BANDAGES/DRESSINGS) IMPLANT
BLADE SURG SZ10 CARB STEEL (BLADE) ×1 IMPLANT
BNDG GAUZE DERMACEA FLUFF 4 (GAUZE/BANDAGES/DRESSINGS) IMPLANT
BNDG GZE DERMACEA 4 6PLY (GAUZE/BANDAGES/DRESSINGS)
CHLORAPREP W/TINT 26 (MISCELLANEOUS) ×1 IMPLANT
COVER SURGICAL LIGHT HANDLE (MISCELLANEOUS) ×1 IMPLANT
DRAIN PENROSE 0.25X18 (DRAIN) IMPLANT
DRAPE LAPAROTOMY TRNSV 102X78 (DRAPES) ×1 IMPLANT
ELECT REM PT RETURN 15FT ADLT (MISCELLANEOUS) ×1 IMPLANT
GAUZE 4X4 16PLY ~~LOC~~+RFID DBL (SPONGE) ×1 IMPLANT
GAUZE PAD ABD 8X10 STRL (GAUZE/BANDAGES/DRESSINGS) IMPLANT
GAUZE SPONGE 4X4 12PLY STRL (GAUZE/BANDAGES/DRESSINGS) IMPLANT
GLOVE BIO SURGEON STRL SZ 6 (GLOVE) ×1 IMPLANT
GLOVE INDICATOR 6.5 STRL GRN (GLOVE) ×1 IMPLANT
GLOVE SS BIOGEL STRL SZ 6 (GLOVE) ×1 IMPLANT
GOWN STRL REUS W/ TWL LRG LVL3 (GOWN DISPOSABLE) ×1 IMPLANT
GOWN STRL REUS W/ TWL XL LVL3 (GOWN DISPOSABLE) IMPLANT
GOWN STRL REUS W/TWL LRG LVL3 (GOWN DISPOSABLE) ×1
GOWN STRL REUS W/TWL XL LVL3 (GOWN DISPOSABLE)
KIT BASIN OR (CUSTOM PROCEDURE TRAY) ×1 IMPLANT
KIT TURNOVER KIT A (KITS) IMPLANT
MARKER SKIN DUAL TIP RULER LAB (MISCELLANEOUS) ×1 IMPLANT
NEEDLE HYPO 22GX1.5 SAFETY (NEEDLE) IMPLANT
PACK GENERAL/GYN (CUSTOM PROCEDURE TRAY) ×1 IMPLANT
SPIKE FLUID TRANSFER (MISCELLANEOUS) ×1 IMPLANT
SUT MNCRL AB 4-0 PS2 18 (SUTURE) ×1 IMPLANT
SUT PROLENE 3 0 SH1 36 (SUTURE) IMPLANT
SYR CONTROL 10ML LL (SYRINGE) IMPLANT
TAPE CLOTH SURG 4X10 WHT LF (GAUZE/BANDAGES/DRESSINGS) IMPLANT
TOWEL OR 17X26 10 PK STRL BLUE (TOWEL DISPOSABLE) ×1 IMPLANT
TOWEL OR NON WOVEN STRL DISP B (DISPOSABLE) ×1 IMPLANT

## 2022-04-28 NOTE — Discharge Instructions (Signed)
GENERAL SURGERY: POST OP INSTRUCTIONS  EAT Gradually transition to a high fiber diet with a fiber supplement over the next few weeks after discharge.  Start with a pureed / full liquid diet (see below)  WALK Walk an hour a day (cumulative, not all at once).  Control your pain to do that.    CONTROL PAIN Control pain so that you can walk, sleep, tolerate sneezing/coughing, go up/down stairs.  HAVE A BOWEL MOVEMENT DAILY Keep your bowels regular to avoid problems.  OK to try a laxative to override constipation.  OK to use an antidairrheal to slow down diarrhea.  Call if not better after 2 tries  CALL IF YOU HAVE PROBLEMS/CONCERNS Call if you are still struggling despite following these instructions. Call if you have concerns not answered by these instructions    DIET: Follow a light bland diet & liquids the first 24 hours after arrival home, such as soup, liquids, starches, etc.  Be sure to drink plenty of fluids.  Quickly advance to a usual solid diet within a few days.  Avoid fast food or heavy meals as you are more likely to get nauseated or have irregular bowels.  A low-sugar, high-fiber diet for the rest of your life is ideal.   Take your usually prescribed home medications unless otherwise directed. PAIN CONTROL: Pain is best controlled by a usual combination of three different methods TOGETHER: Ice/Heat Over the counter pain medication Prescription pain medication Most patients will experience some swelling and bruising around the incisions.  Ice packs or heating pads (30-60 minutes up to 6 times a day) will help. Use ice for the first few days to help decrease swelling and bruising, then switch to heat to help relax tight/sore spots and speed recovery.  Some people prefer to use ice alone, heat alone, alternating between ice & heat.  Experiment to what works for you.  Swelling and bruising can take several weeks to resolve.   It is helpful to take an over-the-counter pain medication  regularly for the first few weeks.  Choose one of the following that works best for you: Naproxen (Aleve, etc)  Two 220mg  tabs twice a day OR Ibuprofen (Advil, etc) Three 200mg  tabs four times a day (every meal & bedtime) AND Acetaminophen (Tylenol, etc) 500-650mg  four times a day (every meal & bedtime) A  prescription for pain medication (such as oxycodone, hydrocodone, etc) should be given to you upon discharge.  Take your pain medication as prescribed.  If you are having problems/concerns with the prescription medicine (does not control pain, nausea, vomiting, rash, itching, etc), please call us [phone] to see if we need to switch you to a different pain medicine that will work better for you and/or control your side effect better. If you need a refill on your pain medication, please contact your pharmacy.  They will contact our office to request authorization. Prescriptions will not be filled after 5 pm or on week-ends. Avoid getting constipated.  Between the surgery and the pain medications, it is common to experience some constipation.  Increasing fluid intake and taking a fiber supplement (such as Metamucil, Citrucel, FiberCon, MiraLax, etc) 1-2 times a day regularly will usually help prevent this problem from occurring.  A mild laxative (prune juice, Milk of Magnesia, MiraLax, etc) should be taken according to package directions if there are no bowel movements after 48 hours.   Wash / shower every day, starting 2 days after surgery.  No rubbing, scrubbing, lotions or  ointments to incision. Let soap and water run over the area and pat dry. Do not submerge/soak. Remove your outer bandage 2 days after surgery.  You may leave the incision open to air.  You may replace a dry gauze dressing/Band-Aid to cover the incision for comfort if you wish.      ACTIVITIES as tolerated:   You may resume regular (light) daily activities beginning the next day--such as daily self-care, walking, climbing  stairs--gradually increasing activities as tolerated.  If you can walk 30 minutes without difficulty, it is safe to try more intense activity such as jogging, treadmill, bicycling, low-impact aerobics, swimming, etc. Save the most intensive and strenuous activity for last such as sit-ups, heavy lifting, contact sports, etc  Refrain from any heavy lifting or straining until you are off narcotics for pain control.   DO NOT PUSH THROUGH PAIN.  Let pain be your guide: If it hurts to do something, don't do it.  Pain is your body warning you to avoid that activity for another week until the pain goes down. You may drive when you are no longer taking prescription pain medication, you can comfortably wear a seatbelt, and you can safely maneuver your car and apply brakes. You may have sexual intercourse when it is comfortable.  FOLLOW UP in our office Please call [facility] at [phone] to set up an appointment to see your surgeon in the office for a follow-up appointment approximately 2-3 weeks after your surgery. Make sure that you call for this appointment the day you arrive home to insure a convenient appointment time. 9. IF YOU HAVE DISABILITY OR FAMILY LEAVE FORMS, BRING THEM TO THE OFFICE FOR PROCESSING.  DO NOT GIVE THEM TO YOUR DOCTOR.   WHEN TO CALL us [phone]: Poor pain control Reactions / problems with new medications (rash/itching, nausea, etc)  Fever over 101.5 F (38.5 C) Worsening swelling or bruising Continued bleeding from incision. Increased pain, redness, or drainage from the incision Difficulty breathing / swallowing   The clinic staff is available to answer your questions during regular business hours (8:30am-5pm).  Please don't hesitate to call and ask to speak to one of our nurses for clinical concerns.   If you have a medical emergency, go to the nearest emergency room or call 911.  A surgeon from [facility] is always on call at the [facility], [place], [place], [place], [place]  [id number] ? MAIN: [phone] ? TOLL FREE: [phone] ?  FAX [phone] [email or web address]

## 2022-04-28 NOTE — Interval H&P Note (Signed)
History and Physical Interval Note:  04/28/2022 7:12 AM  Shannon Velasquez  has presented today for surgery, with the diagnosis of HIDRADENTIS.  The various methods of treatment have been discussed with the patient and family. After consideration of risks, benefits and other options for treatment, the patient has consented to  Procedure(s): EXCISION HIDRADENITIS RIGHT AXILLA (Right) as a surgical intervention.  The patient's history has been reviewed, patient examined, no change in status, stable for surgery.  I have reviewed the patient's chart and labs.  Questions were answered to the patient's satisfaction.     Lorree Millar Rich Brave

## 2022-04-28 NOTE — Anesthesia Postprocedure Evaluation (Signed)
Anesthesia Post Note  Patient: Shannon Velasquez  Procedure(s) Performed: EXCISION HIDRADENITIS RIGHT AXILLA (Right)     Patient location during evaluation: PACU Anesthesia Type: General Level of consciousness: awake and alert Pain management: pain level controlled Vital Signs Assessment: post-procedure vital signs reviewed and stable Respiratory status: spontaneous breathing, nonlabored ventilation, respiratory function stable and patient connected to nasal cannula oxygen Cardiovascular status: blood pressure returned to baseline and stable Postop Assessment: no apparent nausea or vomiting Anesthetic complications: no   No notable events documented.  Last Vitals:  Vitals:   04/28/22 0900 04/28/22 0915  BP: 115/83 117/78  Pulse: 64 64  Resp: 17 16  Temp: 36.4 C 36.4 C  SpO2: 96% 99%    Last Pain:  Vitals:   04/28/22 0915  TempSrc:   PainSc: 0-No pain                 Santa Lighter

## 2022-04-28 NOTE — Transfer of Care (Signed)
Immediate Anesthesia Transfer of Care Note  Patient: Shannon Velasquez  Procedure(s) Performed: EXCISION HIDRADENITIS RIGHT AXILLA (Right)  Patient Location: PACU  Anesthesia Type:General  Level of Consciousness: oriented, drowsy and patient cooperative  Airway & Oxygen Therapy: Patient Spontanous Breathing and Patient connected to face mask oxygen  Post-op Assessment: Report given to RN and Post -op Vital signs reviewed and stable  Post vital signs: Reviewed  Last Vitals:  Vitals Value Taken Time  BP 117/83 04/28/22 0845  Temp 36.4 C 04/28/22 0831  Pulse 70 04/28/22 0856  Resp 17 04/28/22 0856  SpO2 91 % 04/28/22 0856  Vitals shown include unvalidated device data.  Last Pain:  Vitals:   04/28/22 0845  TempSrc:   PainSc: 0-No pain         Complications: No notable events documented.

## 2022-04-28 NOTE — Anesthesia Procedure Notes (Signed)
Procedure Name: Intubation Date/Time: 04/28/2022 7:32 AM  Performed by: Jenne Campus, CRNAPre-anesthesia Checklist: Patient identified, Emergency Drugs available, Suction available and Patient being monitored Patient Re-evaluated:Patient Re-evaluated prior to induction Oxygen Delivery Method: Circle System Utilized Preoxygenation: Pre-oxygenation with 100% oxygen Induction Type: IV induction Ventilation: Mask ventilation without difficulty Laryngoscope Size: Miller and 3 Grade View: Grade I Tube type: Oral Tube size: 7.5 mm Number of attempts: 1 Airway Equipment and Method: Stylet and Oral airway Placement Confirmation: ETT inserted through vocal cords under direct vision, positive ETCO2 and breath sounds checked- equal and bilateral Secured at: 22 cm Tube secured with: Tape Dental Injury: Teeth and Oropharynx as per pre-operative assessment

## 2022-04-28 NOTE — Op Note (Signed)
Operative Note  Meridian Scherger  [id number]  [id number]  04/28/2022   Surgeon: Romana Juniper MD FACS   Assistant: Malachi Pro PA-C   Procedure performed: Excision of right axillary skin and subcutaneous tissue for hidradenitis, 10 x 5 x 4 cm   Preop diagnosis: Hidradenitis Post-op diagnosis/intraop findings: Same   Specimens: Right axillary hidradenitis Retained items: No EBL: Minimal cc Complications: none   Description of procedure: After obtaining informed consent the patient was taken to the operating room and placed supine on operating room table where general endotracheal anesthesia was initiated, preoperative antibiotics were administered, SCDs applied, and a formal timeout was performed.  The right axilla and surrounding skin were prepped and draped in usual sterile fashion.  After infiltration with local (quarter percent Marcaine with epinephrine) an elliptical incision was made encompassing all of the chronic scarring in the right axilla.  Cautery was then used to dissect through the dermis and soft tissue, excising all appreciable granulation tracks and diseased skin and soft tissue down to the level of the fascia.  Hemostasis was ensured within the wound, which measured approximately 10 cm x 5 cm x 4 cm deep.  The deep dermis was reapproximated with interrupted 3-0 Vicryl's and then the skin was closed with a running 3-0 Prolene's starting at either end and tying centrally.  The wound closed with very little tension with the arm abducted.  A dry gauze dressing and tape were applied.  The patient was then awakened, extubated and taken to PACU in stable condition.    All counts were correct at the completion of the case.

## 2022-04-29 ENCOUNTER — Encounter (HOSPITAL_COMMUNITY): Payer: Self-pay | Admitting: Surgery

## 2022-04-29 LAB — SURGICAL PATHOLOGY

## 2022-06-19 ENCOUNTER — Emergency Department (HOSPITAL_BASED_OUTPATIENT_CLINIC_OR_DEPARTMENT_OTHER)
Admission: RE | Admit: 2022-06-19 | Discharge: 2022-06-19 | Disposition: A | Discharge status: Home / Self Care | Payer: Medicaid Other | Source: Ambulatory Visit | Attending: Emergency Medicine | Admitting: Emergency Medicine

## 2022-06-19 ENCOUNTER — Encounter (HOSPITAL_BASED_OUTPATIENT_CLINIC_OR_DEPARTMENT_OTHER): Payer: Self-pay

## 2022-06-19 ENCOUNTER — Emergency Department (HOSPITAL_BASED_OUTPATIENT_CLINIC_OR_DEPARTMENT_OTHER)
Admission: EM | Admit: 2022-06-19 | Discharge: 2022-06-19 | Disposition: A | Discharge status: Home / Self Care | Payer: Medicaid Other | Source: Home / Self Care | Attending: Emergency Medicine | Admitting: Emergency Medicine

## 2022-06-19 ENCOUNTER — Emergency Department (HOSPITAL_COMMUNITY)
Admission: EM | Admit: 2022-06-19 | Discharge: 2022-06-19 | Disposition: A | Discharge status: Home / Self Care | Payer: Medicaid Other | Attending: Emergency Medicine | Admitting: Emergency Medicine

## 2022-06-19 ENCOUNTER — Other Ambulatory Visit: Payer: Self-pay

## 2022-06-19 DIAGNOSIS — L03113 Cellulitis of right upper limb: Secondary | ICD-10-CM | POA: Insufficient documentation

## 2022-06-19 DIAGNOSIS — R52 Pain, unspecified: Secondary | ICD-10-CM

## 2022-06-19 DIAGNOSIS — M7989 Other specified soft tissue disorders: Secondary | ICD-10-CM

## 2022-06-19 DIAGNOSIS — M79631 Pain in right forearm: Secondary | ICD-10-CM | POA: Diagnosis present

## 2022-06-19 MED ORDER — DOXYCYCLINE HYCLATE 100 MG PO CAPS: 100.0000 mg | ORAL_CAPSULE | Freq: Two times a day (BID) | ORAL | 0 refills | Status: DC

## 2022-06-19 MED ORDER — DOXYCYCLINE HYCLATE 100 MG PO TABS
100.0000 mg | ORAL_TABLET | Freq: Once | ORAL | Status: AC
  Administered 2022-06-19: 100 mg via ORAL
  Filled 2022-06-19: qty 1

## 2022-06-19 NOTE — ED Triage Notes (Signed)
The pt is c/o rt arm pain and swelling for the past 4 days.  No known injury  she had surgery on that arm October the  9th for removal of a sweat gland

## 2022-06-19 NOTE — ED Provider Notes (Signed)
[facility] EMERGENCY DEPARTMENT Provider Note   CSN: [id number] Arrival date & time: 06/19/22  1030     History  Chief Complaint  Patient presents with   Arm Swelling    R    Shannon Velasquez is a 36 y.o. female presented to the ER today for reevaluation of her right forearm she reports that over the past 3 days she has noticed some pain and swelling to her right forearm along with a small pimple to the area that she tried to squeeze at home.  She reports pain is warm and aching, worsens with palpation and improves somewhat with rest, pain does not radiate.  She was seen in the ER yesterday for this and started on an antibiotic doxycycline.  She reports she has had 1 dose this morning.  She was also sent for a DVT study of her arm, she reports she had the study done this morning and was told it "did not show a blood clot".  She wanted a second opinion on her forearm which is why she came to the ER today.  She denies fever, chills, fall, injury or any additional concerns.  HPI     Home Medications Prior to Admission medications   Medication Sig Start Date End Date Taking? Authorizing Provider  doxycycline (VIBRAMYCIN) 100 MG capsule Take 1 capsule (100 mg total) by mouth 2 (two) times daily. 06/19/22   Roxy Horseman, PA-C      Allergies    Penicillins    Review of Systems   Review of Systems Ten systems are reviewed and are negative for acute change except as noted in the HPI  Physical Exam Updated Vital Signs BP 127/77 (BP Location: Left Arm)   Pulse 92   Temp 98.4 F (36.9 C) (Oral)   Resp 17   Ht 5' 3.5" (1.613 m)   Wt 115.2 kg   LMP 05/16/2022   SpO2 96%   BMI 44.28 kg/m  Physical Exam Constitutional:      General: She is not in acute distress.    Appearance: Normal appearance. She is well-developed. She is not ill-appearing or diaphoretic.  HENT:     Head: Normocephalic and atraumatic.  Eyes:     General: Vision grossly intact. Gaze aligned  appropriately.     Pupils: Pupils are equal, round, and reactive to light.  Neck:     Trachea: Trachea and phonation normal.  Pulmonary:     Effort: Pulmonary effort is normal. No respiratory distress.  Abdominal:     General: There is no distension.     Palpations: Abdomen is soft.     Tenderness: There is no abdominal tenderness. There is no guarding or rebound.  Musculoskeletal:        General: Normal range of motion.       Arms:     Cervical back: Normal range of motion.     Comments: Dorsal aspect of the right proximal forearm is slightly erythematous with minimal swelling.  Small nonruptured pustule present as well.  There is no streaking.  No skin breaks.  She has full range of motion of the elbow and wrist without pain or difficulty.  Strong radial pulse.  Capillary refill and sensation tact.  Compartments soft.  Skin:    General: Skin is warm and dry.  Neurological:     Mental Status: She is alert.     GCS: GCS eye subscore is 4. GCS verbal subscore is 5. GCS motor subscore is 6.  Comments: Speech is clear and goal oriented, follows commands Major Cranial nerves without deficit, no facial droop Moves extremities without ataxia, coordination intact  Psychiatric:        Behavior: Behavior normal.     ED Results / Procedures / Treatments   Labs (all labs ordered are listed, but only abnormal results are displayed) Labs Reviewed - No data to display  EKG None  Radiology UE VENOUS DUPLEX  Result Date: 06/19/2022 UPPER VENOUS STUDY  Patient Name:  Shannon Velasquez  Date of Exam:   06/19/2022 Medical Rec #: [id number]       Accession #:    [id number] Date of Birth: [birth year]        Patient Gender: F Patient Age:   59 years Exam Location:  [facility] Procedure:      VAS [place] UPPER EXTREMITY VENOUS DUPLEX Referring Phys: Roxy Horseman --------------------------------------------------------------------------------  Indications: Pain, and Swelling Other Indications:  Status post Excision Hidradenitis Right Axilla on 04/28/22. Performing Technologist: Marilynne Halsted RDMS, RVT  Examination Guidelines: A complete evaluation includes B-mode imaging, spectral Doppler, color Doppler, and power Doppler as needed of all accessible portions of each vessel. Bilateral testing is considered an integral part of a complete examination. Limited examinations for reoccurring indications may be performed as noted.  Right Findings: +----------+------------+---------+-----------+----------+-------+ RIGHT     CompressiblePhasicitySpontaneousPropertiesSummary +----------+------------+---------+-----------+----------+-------+ IJV           Full       Yes       Yes                      +----------+------------+---------+-----------+----------+-------+ Subclavian               Yes       Yes                      +----------+------------+---------+-----------+----------+-------+ Axillary      Full       Yes       Yes                      +----------+------------+---------+-----------+----------+-------+ Brachial      Full                                          +----------+------------+---------+-----------+----------+-------+ Radial        Full                                          +----------+------------+---------+-----------+----------+-------+ Ulnar         Full                                          +----------+------------+---------+-----------+----------+-------+ Cephalic      Full                                          +----------+------------+---------+-----------+----------+-------+ Basilic       Full                                          +----------+------------+---------+-----------+----------+-------+  Left Findings: +----------+------------+---------+-----------+----------+-------+ LEFT      CompressiblePhasicitySpontaneousPropertiesSummary +----------+------------+---------+-----------+----------+-------+  Subclavian               Yes       Yes                      +----------+------------+---------+-----------+----------+-------+  Summary:  Right: No evidence of deep vein thrombosis in the upper extremity. No evidence of superficial vein thrombosis in the upper extremity.  Left: No evidence of thrombosis in the subclavian.  *See table(s) above for measurements and observations.    Preliminary     Procedures Procedures    Medications Ordered in ED Medications - No data to display  ED Course/ Medical Decision Making/ A&P                           Medical Decision Making 36 year old female presented for a second opinion of some pain and swelling to her right proximal dorsal forearm over the past 3 days.  She was seen in the ER yesterday and was thought to have cellulitis she was started on doxycycline she took her first dose this morning.  She was also sent for a Doppler of her right upper extremity, chart review it appears this was negative.  She has no systemic symptoms.  She has no pain with motion of the joints above or below the area of erythema and swelling.  She is overall well-appearing and in no acute distress.  I agree with previous providers assessment as this appears to be cellulitis, she has no systemic symptoms to suggest sepsis.  No pain with ROM, below suspicion for septic joint or osteomyelitis at this time.  I did offer radiographs for the patient for reassurance and rule out of underlying osseous abnormalities, patient declined.  No negation for blood work at this time.  Discussed treatment option with the patient today, she would like to try the medication for the next 2 days and will return to the ER if not improving.  I discussed strict return precautions with the patient today and she stated understanding.  Area was demarcated patient aware that if swelling extends past the area she is to return to the ER for further treatment.  Low suspicion at this time to suggest septic  joint, sepsis, DVT, compartment syndrome, fracture, neurovascular compromise or other emergent medical conditions at this time.   At this time there does not appear to be any evidence of an acute emergency medical condition and the patient appears stable for discharge with appropriate outpatient follow up. Diagnosis was discussed with patient who verbalizes understanding of care plan and is agreeable to discharge. I have discussed return precautions with patient who verbalizes understanding. Patient encouraged to follow-up with their PCP. All questions answered.  Patient's case discussed with Dr. Synetta Shadow who agrees with plan to discharge with follow-up.   Note: Portions of this report may have been transcribed using voice recognition software. Every effort was made to ensure accuracy; however, inadvertent computerized transcription errors may still be present.         Final Clinical Impression(s) / ED Diagnoses Final diagnoses:  Cellulitis of right upper extremity    Rx / DC Orders ED Discharge Orders     None         Elizabeth Palau 06/19/22 1134    Lonell Grandchild, MD 06/20/22 [phone]

## 2022-06-19 NOTE — ED Notes (Signed)
Discharge instructions reviewed with pt. Pt verbalizes understanding. Belongings with pt upon depart. Shoulder sling provided to pt prior to discharge. Ambulatory to POV.

## 2022-06-19 NOTE — ED Triage Notes (Addendum)
Pt c/o R arm swelling x3 days; states she has "white bump" by elbow. No fevers, advises she's taken leftover oxycodone for pain. Seen at [facility] for same, referred for OP [place]. States she wants "more tests" done

## 2022-06-19 NOTE — Discharge Instructions (Addendum)
At this time there does not appear to be the presence of an emergent medical condition, however there is always the potential for conditions to change. Please read and follow the below instructions.  Please return to the Emergency Department immediately for any new or worsening symptoms or if your symptoms do not improve within 2 days. Please be sure to follow up with your Primary Care Provider within one week regarding your visit today; please call their office to schedule an appointment even if you are feeling better for a follow-up visit. Please take your antibiotic Doxycycline as prescribed until complete to help with your symptoms.  Please drink enough water to avoid dehydration and get plenty of rest.  Please read the additional information packets attached to your discharge summary.  Go to the nearest Emergency Department immediately if: You have fever or chills  Do not take your medicine if  develop an itchy rash, swelling in your mouth or lips, or difficulty breathing; call 911 and seek immediate emergency medical attention if this occurs.  You may review your lab tests and imaging results in their entirety on your MyChart account.  Please discuss all results of fully with your primary care provider and other specialist at your follow-up visit.  Note: Portions of this text may have been transcribed using voice recognition software. Every effort was made to ensure accuracy; however, inadvertent computerized transcription errors may still be present.

## 2022-06-19 NOTE — ED Provider Notes (Signed)
[facility] Emergency Department Provider Note MRN:  [id number]  Arrival date & time: 06/19/22     Chief Complaint   Arm Pain   History of Present Illness   Shannon Velasquez is a 36 y.o. year-old female presents to the ED with chief complaint of right forearm pain and swelling.  Onset 3 days ago.  She denies any injury.  Denies fever, chills, recent IV access.  States that it feels warm to touch.  Denies hx of DVT.  Denies pregnancy or breastfeeding.  History provided by patient.   Review of Systems  Pertinent positive and negative review of systems noted in HPI.    Physical Exam   Vitals:   06/19/22 0251  BP: 118/78  Pulse: 88  Resp: 17  Temp: 98 F (36.7 C)  SpO2: 98%    CONSTITUTIONAL:  well-appearing, NAD NEURO:  Alert and oriented x 3, CN 3-12 grossly intact EYES:  eyes equal and reactive ENT/NECK:  Supple, no stridor  CARDIO:  appears well-perfused, normal distal pulse  PULM:  No respiratory distress GI/GU:  non-distended,  MSK/SPINE:  No gross deformities, no edema, moves all extremities, no trouble moving the elbow SKIN:  no rash, atraumatic, warm and swollen right forearm just distal to the elbow, no visible abscess, no wounds, mildly erythematous   *Additional and/or pertinent findings included in MDM below  Diagnostic and Interventional Summary    EKG Interpretation  Date/Time:    Ventricular Rate:    PR Interval:    QRS Duration:   QT Interval:    QTC Calculation:   R Axis:     Text Interpretation:         Labs Reviewed - No data to display  US Venous Img Upper Uni Right(DVT)    (Results Pending)  UE VENOUS DUPLEX    (Results Pending)    Medications  doxycycline (VIBRA-TABS) tablet 100 mg (100 mg Oral Given 06/19/22 0417)     Procedures  /  Critical Care Procedures  ED Course and Medical Decision Making  I have reviewed the triage vital signs, the nursing notes, and pertinent available records from the  EMR.  Social Determinants Affecting Complexity of Care: Patient has no clinically significant social determinants affecting this chief complaint..   ED Course:    Medical Decision Making Patient here with redness and swelling to the right forearm.  Seems consistent with cellulitis.  No trauma.  Able to range right elbow without difficulty.  Doubt septic arthritis.  Gout thought less likely as well.  Right arm proximal to the elbow is soft and unaffected as is the right wrist.  The affected location is just distal to the elbow.  I suspect cellulitis.  UE DVT not ruled out.  Will order outpatient [place].  Discussed plan with patient.  Will treat with doxy. She denies pregnancy or breastfeeding.  Risk Prescription drug management.     Consultants: No consultations were needed in caring for this patient.   Treatment and Plan: Emergency department workup does not suggest an emergent condition requiring admission or immediate intervention beyond  what has been performed at this time. The patient is safe for discharge and has  been instructed to return immediately for worsening symptoms, change in  symptoms or any other concerns    Final Clinical Impressions(s) / ED Diagnoses     ICD-10-CM   1. Cellulitis of right upper extremity  L03.113       ED Discharge Orders  Ordered    US Venous Img Upper Uni Right(DVT)        06/19/22 0412    doxycycline (VIBRAMYCIN) 100 MG capsule  2 times daily        06/19/22 0413    UE VENOUS DUPLEX        06/19/22 0417              Discharge Instructions Discussed with and Provided to Patient:   Discharge Instructions   None      Montine Circle, PA-C 06/19/22 0424    Quintella Reichert, MD 06/19/22 [phone]

## 2022-07-04 DIAGNOSIS — L732 Hidradenitis suppurativa: Secondary | ICD-10-CM | POA: Insufficient documentation

## 2022-08-21 ENCOUNTER — Encounter: Payer: Self-pay | Admitting: Family Medicine

## 2022-08-21 ENCOUNTER — Ambulatory Visit (INDEPENDENT_AMBULATORY_CARE_PROVIDER_SITE_OTHER): Payer: Medicaid Other | Admitting: Family Medicine

## 2022-08-21 ENCOUNTER — Other Ambulatory Visit (HOSPITAL_COMMUNITY)
Admission: RE | Admit: 2022-08-21 | Discharge: 2022-08-21 | Disposition: A | Discharge status: Home / Self Care | Payer: Medicaid Other | Source: Ambulatory Visit | Attending: Family Medicine | Admitting: Family Medicine

## 2022-08-21 VITALS — BP 125/85 | HR 77 | Ht 63.5 in | Wt 272.0 lb

## 2022-08-21 DIAGNOSIS — Z124 Encounter for screening for malignant neoplasm of cervix: Secondary | ICD-10-CM | POA: Insufficient documentation

## 2022-08-21 DIAGNOSIS — Z01419 Encounter for gynecological examination (general) (routine) without abnormal findings: Secondary | ICD-10-CM | POA: Diagnosis not present

## 2022-08-21 NOTE — Progress Notes (Signed)
   GYNECOLOGY OFFICE VISIT NOTE  History:   Shannon Velasquez is a 37 y.o. G2P2002 here today for her annual exam. She denies any abnormal vaginal discharge, bleeding, pelvic pain or other concerns.  Her last pap was >3 years ago and was normal. She reports a family history of breast cancer in her MGM diagnosed in her 97s, no other family history of breast cancer.  She has 2 children 50 and 12 year olds, both of which are doing well.    Past Medical History:  Diagnosis Date   Epilepsy ([place])    last seizure age 30 years   Epilepsy [facility])     Past Surgical History:  Procedure Laterality Date   BARTHOLIN CYST MARSUPIALIZATION     HYDRADENITIS EXCISION Right 04/28/2022   Procedure: EXCISION HIDRADENITIS RIGHT AXILLA;  Surgeon: Clovis Riley, MD;  Location: [facility] ORS;  Service: General;  Laterality: Right;    The following portions of the patient's history were reviewed and updated as appropriate: allergies, current medications, past family history, past medical history, past social history, past surgical history and problem list.   Health Maintenance:  due for pap smear, no indication for mammogram at this time.  Review of Systems:  Pertinent items noted in HPI and remainder of comprehensive ROS otherwise negative.  Physical Exam:  BP 125/85   Pulse 77   Ht 5' 3.5" (1.613 m)   Wt 272 lb (123.4 kg)   LMP 07/28/2022 (Approximate)   BMI 47.43 kg/m  CONSTITUTIONAL: Well-developed, well-nourished female in no acute distress.  HEENT:  Normocephalic, atraumatic. External right and left ear normal. No scleral icterus.  NECK: Normal range of motion, supple, no masses noted on observation BREAST: Exam performed with a chaperone.  Breasts are symmetrical bilaterally.  No skin changes.  No abnormal lumps on palpation, no nipple discharge.  No lymph nodes within the bilateral axilla. SKIN: No rash noted. Not diaphoretic. No erythema. No pallor. MUSCULOSKELETAL: Normal range of motion. No edema  noted. NEUROLOGIC: Alert and oriented to person, place, and time. Normal muscle tone coordination. No cranial nerve deficit noted. PSYCHIATRIC: Normal mood and affect. Normal behavior. Normal judgment and thought content. CARDIOVASCULAR: Normal heart rate noted RESPIRATORY: Effort and breath sounds normal, no problems with respiration noted ABDOMEN: No masses noted. No other overt distention noted.   PELVIC: Normal appearing external genitalia; normal urethral meatus; normal appearing vaginal mucosa and cervix.  No abnormal discharge noted.  Normal uterine size, no other palpable masses, no uterine or adnexal tenderness. Performed in the presence of a chaperone  Labs and Imaging No results found for this or any previous visit (from the past 168 hour(s)). No results found.     Assessment and Plan:  Encounter for annual routine gynecological examination  Cervical cancer screening - Plan: Cytology - PAP( [place])  Overall healthy, with no concerns.  Pap smear collected. Breast exam normal. Recommend intermittent self breast exams.  Patient had questions about risk of pregnancy at her age. We discussed AMA and risk of diabetes, hypertension, preterm delivery, growth abnormalities and genetic defects. Overall, still not too late if she really wanted to conceive. Will just need some additional monitoring.  Follow up as needed.  Liliane Channel MD MPH OB Fellow, [place] for [place] 08/21/2022

## 2022-08-21 NOTE — Progress Notes (Signed)
New Pt is in the office for annual Pt reports last pap has been over 3 years and normal LMP 07/28/22 Pt declines BC and STD testing today. Denies any complications

## 2022-08-25 LAB — CYTOLOGY - PAP
Comment: NEGATIVE
Diagnosis: NEGATIVE
High risk HPV: NEGATIVE

## 2022-10-09 ENCOUNTER — Emergency Department (HOSPITAL_BASED_OUTPATIENT_CLINIC_OR_DEPARTMENT_OTHER)
Admission: EM | Admit: 2022-10-09 | Discharge: 2022-10-09 | Disposition: A | Discharge status: Home / Self Care | Payer: Medicaid Other | Attending: Emergency Medicine | Admitting: Emergency Medicine

## 2022-10-09 ENCOUNTER — Other Ambulatory Visit: Payer: Self-pay

## 2022-10-09 ENCOUNTER — Encounter (HOSPITAL_BASED_OUTPATIENT_CLINIC_OR_DEPARTMENT_OTHER): Payer: Self-pay

## 2022-10-09 DIAGNOSIS — Z1152 Encounter for screening for COVID-19: Secondary | ICD-10-CM | POA: Diagnosis not present

## 2022-10-09 DIAGNOSIS — J020 Streptococcal pharyngitis: Secondary | ICD-10-CM | POA: Insufficient documentation

## 2022-10-09 DIAGNOSIS — R519 Headache, unspecified: Secondary | ICD-10-CM

## 2022-10-09 DIAGNOSIS — J029 Acute pharyngitis, unspecified: Secondary | ICD-10-CM

## 2022-10-09 LAB — GROUP A STREP BY PCR: Group A Strep by PCR: DETECTED — AB

## 2022-10-09 LAB — RESP PANEL BY RT-PCR (RSV, FLU A&B, COVID)  RVPGX2
Influenza A by PCR: NEGATIVE
Influenza B by PCR: NEGATIVE
Resp Syncytial Virus by PCR: NEGATIVE
SARS Coronavirus 2 by RT PCR: NEGATIVE

## 2022-10-09 MED ORDER — CLINDAMYCIN HCL 300 MG PO CAPS: 300.0000 mg | ORAL_CAPSULE | Freq: Three times a day (TID) | ORAL | 0 refills | Status: AC

## 2022-10-09 MED ORDER — PROCHLORPERAZINE MALEATE 10 MG PO TABS: 10.0000 mg | ORAL_TABLET | Freq: Two times a day (BID) | ORAL | 0 refills | Status: DC | PRN

## 2022-10-09 NOTE — Discharge Instructions (Signed)
Your history, exam, evaluation are consistent with strep throat causing your symptoms and also likely triggering the headache.  Your exam was otherwise reassuring and we agreed together to hold on extensive labs or CT imaging.  Given your lack of neck stiffness or other complaints we have low suspicion for more concerning infection such as meningitis or peritonsillar abscess.  We do feel you are safe for discharge home and recommend taking the antibiotics for the next 10 days and follow-up with your primary doctor.  If any symptoms change or worsen acutely, please return to the nearest emergency department.

## 2022-10-09 NOTE — ED Notes (Signed)
Discharge paperwork reviewed entirely with patient, including Rx's and follow up care. Pain was under control. Pt verbalized understanding as well as all parties involved. No questions or concerns voiced at the time of discharge. No acute distress noted.   Pt ambulated out to PVA without incident or assistance.  

## 2022-10-09 NOTE — ED Triage Notes (Addendum)
Pt arrives with c/o headache, sore throat, and chills that started today. Pt unsure of fevers. Pt anxious in triage. Pt denies cough or congestion.

## 2022-10-09 NOTE — ED Provider Notes (Signed)
[facility] Provider Note   CSN: [id number] Arrival date & time: 10/09/22  1929     History  Chief Complaint  Patient presents with   URI    Shannon Velasquez is a 37 y.o. female.  The history is provided by the patient and medical records. No language interpreter was used.  URI Presenting symptoms: fatigue and sore throat   Presenting symptoms: no congestion, no cough, no ear pain and no fever   Severity:  Severe Onset quality:  Gradual Duration:  1 day Timing:  Constant Progression:  Waxing and waning Chronicity:  Recurrent Relieved by:  Nothing Worsened by:  Nothing Ineffective treatments:  None tried Associated symptoms: headaches   Associated symptoms: no myalgias, no neck pain, no sinus pain, no sneezing, no swollen glands and no wheezing   Risk factors: no recent travel and no sick contacts        Home Medications Prior to Admission medications   Not on File      Allergies    Penicillins    Review of Systems   Review of Systems  Constitutional:  Positive for chills and fatigue. Negative for fever.  HENT:  Positive for sore throat. Negative for congestion, ear pain, sinus pain, sneezing and trouble swallowing.   Eyes:  Negative for visual disturbance.  Respiratory:  Negative for cough, chest tightness, shortness of breath and wheezing.   Cardiovascular:  Negative for chest pain, palpitations and leg swelling.  Gastrointestinal:  Negative for constipation, diarrhea, nausea and vomiting.  Genitourinary:  Negative for dysuria.  Musculoskeletal:  Negative for back pain, myalgias, neck pain and neck stiffness.  Skin:  Negative for rash and wound.  Neurological:  Positive for headaches. Negative for tremors, seizures (hx but none for many years), weakness, light-headedness and numbness.  Psychiatric/Behavioral:  Negative for agitation and confusion.   All other systems reviewed and are negative.   Physical  Exam Updated Vital Signs BP 124/65 (BP Location: Left Arm)   Pulse (!) 135   Temp 99.8 F (37.7 C) (Oral)   Resp 18   Wt 111.1 kg   SpO2 98%   BMI 42.72 kg/m  Physical Exam Vitals and nursing note reviewed.  Constitutional:      General: She is not in acute distress.    Appearance: She is well-developed. She is not ill-appearing, toxic-appearing or diaphoretic.  HENT:     Head: Normocephalic and atraumatic.     Nose: No congestion or rhinorrhea.     Mouth/Throat:     Mouth: Mucous membranes are moist.     Pharynx: Posterior oropharyngeal erythema present.  Eyes:     Extraocular Movements: Extraocular movements intact.     Conjunctiva/sclera: Conjunctivae normal.     Pupils: Pupils are equal, round, and reactive to light.  Neck:     Vascular: No carotid bruit.  Cardiovascular:     Rate and Rhythm: Normal rate and regular rhythm.     Pulses: Normal pulses.     Heart sounds: No murmur heard. Pulmonary:     Effort: Pulmonary effort is normal. No respiratory distress.     Breath sounds: Normal breath sounds. No wheezing, rhonchi or rales.  Chest:     Chest wall: No tenderness.  Abdominal:     General: Abdomen is flat.     Palpations: Abdomen is soft.     Tenderness: There is no abdominal tenderness. There is no guarding or rebound.  Musculoskeletal:  General: No swelling or tenderness.     Cervical back: Neck supple. No tenderness.  Skin:    General: Skin is warm and dry.     Capillary Refill: Capillary refill takes less than 2 seconds.     Findings: No erythema or rash.  Neurological:     General: No focal deficit present.     Mental Status: She is alert.     Sensory: No sensory deficit.     Motor: No weakness.  Psychiatric:        Mood and Affect: Mood normal.     ED Results / Procedures / Treatments   Labs (all labs ordered are listed, but only abnormal results are displayed) Labs Reviewed  GROUP A STREP BY PCR - Abnormal; Notable for the following  components:      Result Value   Group A Strep by PCR DETECTED (*)    All other components within normal limits  RESP PANEL BY RT-PCR (RSV, FLU A&B, COVID)  [id number]    EKG None  Radiology No results found.  Procedures Procedures    Medications Ordered in ED Medications - No data to display  ED Course/ Medical Decision Making/ A&P                             Medical Decision Making Risk Prescription drug management.    Shannon Velasquez is a 37 y.o. female with a past medical history significant for remote epilepsy and previous strep throat infections who presents with sore throat, chills, and headache.  According to patient, starting today she began having sore throat and some headache.  She denies any neck pain or neck stiffness.  Denies nausea, vomiting, constipation, diarrhea, or urinary changes.  Denies any congestion or cough.  She reports some chills but no actual fevers at home.  No neurologic complaints.  No head trauma.  No difficulty swallowing or breathing.  Reports mild to moderate discomfort in her throat and similar headache.  Headache is approximately 5 out of 10 in severity.  No vision changes or speech changes.  On exam, lungs clear.  Chest nontender.  Abdomen nontender.  No focal neurologic deficits with intact sensation and strength in extremities.  Symmetric smile.  Clear speech.  Pupils are symmetric and reactive with normal extract movements.  Normal neck range of motion without any nuchal rigidity or neck tenderness or back pain.  She has posterior oropharyngeal erythema but no evidence of PTA or RPA.  Uvula is midline.  No tonsillar exudate.  Patient had swab to rule out viral infection and did not see evidence of COVID/flu/RSV and the strep swab was positive.  Given her history and symptoms I do suspect she strep throat that is triggering a headache.  She reports she has had similar headaches in the past.  Given her reassuring exam and lack of fever have low  suspicion that she has meningitis at this time.  Patient and I agree to hold on labs or CT imaging and neck or lumbar puncture.  Will give antibiotics for her strep throat and she has a penicillin allergy so we will get clindamycin.  We also offered a headache cocktail for her headache now but she would rather get the prescription for Compazine to take at home and rest.  We discussed using Benadryl as well.  She had no other questions or concerns and was discharged in good condition understanding return precautions and follow-up.  Final Clinical Impression(s) / ED Diagnoses Final diagnoses:  Strep pharyngitis  Sore throat  Nonintractable headache, unspecified chronicity pattern, unspecified headache type    Rx / DC Orders ED Discharge Orders          Ordered    clindamycin (CLEOCIN) 300 MG capsule  3 times daily        10/09/22 2110    prochlorperazine (COMPAZINE) 10 MG tablet  2 times daily PRN        10/09/22 2110            Clinical Impression: 1. Strep pharyngitis   2. Sore throat   3. Nonintractable headache, unspecified chronicity pattern, unspecified headache type     Disposition: Discharge  Condition: Good  I have discussed the results, Dx and Tx plan with the pt(& family if present). He/she/they expressed understanding and agree(s) with the plan. Discharge instructions discussed at great length. Strict return precautions discussed and pt &/or family have verbalized understanding of the instructions. No further questions at time of discharge.    New Prescriptions   CLINDAMYCIN (CLEOCIN) 300 MG CAPSULE    Take 1 capsule (300 mg total) by mouth 3 (three) times daily for 10 days.   PROCHLORPERAZINE (COMPAZINE) 10 MG TABLET    Take 1 tablet (10 mg total) by mouth 2 (two) times daily as needed for nausea or vomiting (and headache).    Follow Up: Bryant [facility] [place]  [phone] Schedule an appointment as soon as possible for a visit    [facility] Emergency Department at [facility] [address] [id number] mc [place] [phone]        Kawana Hegel, Gwenyth Allegra, MD 10/09/22 2119

## 2022-10-23 ENCOUNTER — Ambulatory Visit (INDEPENDENT_AMBULATORY_CARE_PROVIDER_SITE_OTHER): Payer: Medicaid Other | Admitting: Nurse Practitioner

## 2022-10-23 ENCOUNTER — Encounter: Payer: Self-pay | Admitting: Nurse Practitioner

## 2022-10-23 VITALS — BP 108/73 | HR 66 | Temp 97.6°F | Ht 64.0 in | Wt 261.0 lb

## 2022-10-23 DIAGNOSIS — G40909 Epilepsy, unspecified, not intractable, without status epilepticus: Secondary | ICD-10-CM | POA: Diagnosis not present

## 2022-10-23 DIAGNOSIS — Z6841 Body Mass Index (BMI) 40.0 and over, adult: Secondary | ICD-10-CM | POA: Diagnosis not present

## 2022-10-23 NOTE — Progress Notes (Signed)
Office: [phone]  /  Fax: [phone]   Initial Visit  Shannon Velasquez was seen in clinic today to evaluate for obesity. She is interested in losing weight to improve overall health and reduce the risk of weight related complications. She presents today to review program treatment options, initial physical assessment, and evaluation.     She was referred by: PCP  When asked what else they would like to accomplish? She states: Adopt healthier eating patterns and Lose a target amount of weight : 150 lbs  Weight history:  She started gaining weight in her 20s after she started having children.  Her weight has fluctuated over the years.    When asked how has your weight affected you? She states: Has affected self-esteem  Some associated conditions: epilepsy, hidradenitis and migraines.     Contributing factors: Family history, Stress, and Pregnancy  Weight promoting medications identified: None  Current nutrition plan: None  Current level of physical activity: Walking  Current or previous pharmacotherapy: None  Response to medication: Never tried medications   Past medical history includes:   Past Medical History:  Diagnosis Date   Epilepsy    last seizure age 89 years   Epilepsy      Objective:   BP 108/73   Pulse 66   Temp 97.6 F (36.4 C)   Ht 5\' 4"  (1.626 m)   Wt 261 lb (118.4 kg)   SpO2 98%   BMI 44.80 kg/m  She was weighed on the bioimpedance scale: Body mass index is 44.8 kg/m.  Peak Weight:261 , Body Fat%:48.8, Visceral Fat Rating:14, Weight trend over the last 12 months: Increasing  General:  Alert, oriented and cooperative. Patient is in no acute distress.  Respiratory: Normal respiratory effort, no problems with respiration noted   Gait: able to ambulate independently  Mental Status: Normal mood and affect. Normal behavior. Normal judgment and thought content.   DIAGNOSTIC DATA REVIEWED:  BMET    Component Value Date/Time   NA 134 (L)  07/02/2021 1611   K 3.4 (L) 07/02/2021 1611   CL 101 07/02/2021 1611   CO2 24 07/02/2021 1611   GLUCOSE 110 (H) 07/02/2021 1611   BUN 11 07/02/2021 1611   CREATININE 0.95 07/02/2021 1611   CALCIUM 8.7 (L) 07/02/2021 1611   GFRNONAA >60 07/02/2021 1611   GFRAA >60 08/14/2017 0919   No results found for: "HGBA1C" No results found for: "INSULIN" CBC    Component Value Date/Time   WBC 5.8 04/17/2022 1430   RBC 5.03 04/17/2022 1430   HGB 13.3 04/17/2022 1430   HCT 40.9 04/17/2022 1430   PLT 325 04/17/2022 1430   MCV 81.3 04/17/2022 1430   MCH 26.4 04/17/2022 1430   MCHC 32.5 04/17/2022 1430   RDW 16.4 (H) 04/17/2022 1430   Iron/TIBC/Ferritin/ %Sat No results found for: "IRON", "TIBC", "FERRITIN", "IRONPCTSAT" Lipid Panel  No results found for: "CHOL", "TRIG", "HDL", "CHOLHDL", "VLDL", "LDLCALC", "LDLDIRECT" Hepatic Function Panel  No results found for: "PROT", "ALBUMIN", "AST", "ALT", "ALKPHOS", "BILITOT", "BILIDIR", "IBILI" No results found for: "TSH"   Assessment and Plan:   Nonintractable epilepsy without status epilepticus, unspecified epilepsy type Not currently on meds.  Avoid Wellbutrin due to history of seizures.    Morbid obesity  BMI 40.0-44.9, adult        Obesity Treatment / Action Plan:  Patient will work on garnering support from family and friends to begin weight loss journey. Will work on eliminating or reducing the presence of highly palatable,  calorie dense foods in the home. Will complete provided nutritional and psychosocial assessment questionnaire before the next appointment. Will be scheduled for indirect calorimetry to determine resting energy expenditure in a fasting state.  This will allow [place] to create a reduced calorie, high-protein meal plan to promote loss of fat mass while preserving muscle mass.  Obesity Education Performed Today:  She was weighed on the bioimpedance scale and results were discussed and documented in the  synopsis.  We discussed obesity as a disease and the importance of a more detailed evaluation of all the factors contributing to the disease.  We discussed the importance of long term lifestyle changes which include nutrition, exercise and behavioral modifications as well as the importance of customizing this to her specific health and social needs.  We discussed the benefits of reaching a healthier weight to alleviate the symptoms of existing conditions and reduce the risks of the biomechanical, metabolic and psychological effects of obesity.  Shannon Velasquez appears to be in the action stage of change and states they are ready to start intensive lifestyle modifications and behavioral modifications.  30 minutes was spent today on this visit including the above counseling, pre-visit chart review, and post-visit documentation.  Reviewed by clinician on day of visit: allergies, medications, problem list, medical history, surgical history, family history, social history, and previous encounter notes pertinent to obesity diagnosis.    [name] Loistine Eberlin FNP-C

## 2022-11-24 ENCOUNTER — Ambulatory Visit: Payer: Medicaid Other | Admitting: Bariatrics

## 2022-12-08 ENCOUNTER — Ambulatory Visit: Payer: Medicaid Other | Admitting: Nurse Practitioner

## 2022-12-18 ENCOUNTER — Ambulatory Visit: Payer: Self-pay | Admitting: Surgery

## 2022-12-18 NOTE — H&P (Signed)
  Shannon Velasquez [id number]  Interval History:   Returns with his recurrent hidradenitis symptoms in the right axilla and ongoing symptoms on the left side. She developed a boil in the right axilla that spontaneously drained. There is still some residual induration and swelling in this area and a chronic granulating skin area. On the left side she has several sinus tracts and has intermittent swelling and drainage from a couple different places that are not necessarily connected.  Last seen 06/26/2022 here for wound check. Developed a likely insect bite with associated inflammation of the right elbow which she has been seen in the ER for started on doxycycline. This is improving. No drainage from the axilla that she is noted.  Physical Examination:   There were no vitals filed for this visit.  Alert, well-appearing Unlabored respirations Right axilla- appx 2.5cm area of subcutaneous induration with overlying 1cm granulating wound. No active drainage currently. Left axilla- no active drainage, several areas of scar  Assessment and Plan:   Unfortunately has recurrent disease on the right. Ongoing disease on the left. Discussed at this point, further surgical treatment would entail wide excision of the axillary skin and subcutaneous tissue with rhomboid flap coverage by plastics. Would ideally do these one-sided a time. In hopes of avoiding surgery, we have tried a course of topical clindamycin/tretinoin, referred to dermatology for consideration of immunomodulator therapy.  Patient wishes to proceed with excision of the left side.  Plan to proceed with assistance from Dr. Leta Baptist for flap coverage.  Shannon Velasquez Shannon Grippe, MD

## 2022-12-18 NOTE — H&P (View-Only) (Signed)
  Shannon Velasquez [id number]  Interval History:   Returns with his recurrent hidradenitis symptoms in the right axilla and ongoing symptoms on the left side. She developed a boil in the right axilla that spontaneously drained. There is still some residual induration and swelling in this area and a chronic granulating skin area. On the left side she has several sinus tracts and has intermittent swelling and drainage from a couple different places that are not necessarily connected.  Last seen 06/26/2022 here for wound check. Developed a likely insect bite with associated inflammation of the right elbow which she has been seen in the ER for started on doxycycline. This is improving. No drainage from the axilla that she is noted.  Physical Examination:   There were no vitals filed for this visit.  Alert, well-appearing Unlabored respirations Right axilla- appx 2.5cm area of subcutaneous induration with overlying 1cm granulating wound. No active drainage currently. Left axilla- no active drainage, several areas of scar  Assessment and Plan:   Unfortunately has recurrent disease on the right. Ongoing disease on the left. Discussed at this point, further surgical treatment would entail wide excision of the axillary skin and subcutaneous tissue with rhomboid flap coverage by plastics. Would ideally do these one-sided a time. In hopes of avoiding surgery, we have tried a course of topical clindamycin/tretinoin, referred to dermatology for consideration of immunomodulator therapy.  Patient wishes to proceed with excision of the left side.  Plan to proceed with assistance from Dr. Thimmappa for flap coverage.  Shannon Velasquez Shannon Greig Altergott, MD  

## 2023-01-05 ENCOUNTER — Encounter (HOSPITAL_BASED_OUTPATIENT_CLINIC_OR_DEPARTMENT_OTHER): Payer: Self-pay | Admitting: Surgery

## 2023-01-05 ENCOUNTER — Other Ambulatory Visit: Payer: Self-pay

## 2023-01-08 MED ORDER — CHLORHEXIDINE GLUCONATE CLOTH 2 % EX PADS: 6.0000 | MEDICATED_PAD | Freq: Once | CUTANEOUS | Status: DC

## 2023-01-13 ENCOUNTER — Encounter (HOSPITAL_BASED_OUTPATIENT_CLINIC_OR_DEPARTMENT_OTHER)
Admission: RE | Disposition: A | Discharge status: Home / Self Care | Payer: Self-pay | Source: Ambulatory Visit | Attending: Surgery

## 2023-01-13 ENCOUNTER — Ambulatory Visit (HOSPITAL_BASED_OUTPATIENT_CLINIC_OR_DEPARTMENT_OTHER)
Admission: RE | Admit: 2023-01-13 | Discharge: 2023-01-13 | Disposition: A | Discharge status: Home / Self Care | Payer: Medicaid Other | Source: Ambulatory Visit | Attending: Surgery | Admitting: Surgery

## 2023-01-13 ENCOUNTER — Ambulatory Visit (HOSPITAL_BASED_OUTPATIENT_CLINIC_OR_DEPARTMENT_OTHER): Payer: Medicaid Other | Admitting: Anesthesiology

## 2023-01-13 ENCOUNTER — Other Ambulatory Visit: Payer: Self-pay

## 2023-01-13 ENCOUNTER — Encounter (HOSPITAL_BASED_OUTPATIENT_CLINIC_OR_DEPARTMENT_OTHER): Payer: Self-pay | Admitting: Surgery

## 2023-01-13 DIAGNOSIS — L720 Epidermal cyst: Secondary | ICD-10-CM | POA: Diagnosis not present

## 2023-01-13 DIAGNOSIS — L732 Hidradenitis suppurativa: Secondary | ICD-10-CM

## 2023-01-13 DIAGNOSIS — Z01818 Encounter for other preprocedural examination: Secondary | ICD-10-CM

## 2023-01-13 HISTORY — DX: Hidradenitis suppurativa: L73.2

## 2023-01-13 HISTORY — PX: ADJACENT TISSUE TRANSFER/TISSUE REARRANGEMENT: SHX6829

## 2023-01-13 HISTORY — PX: HYDRADENITIS EXCISION: SHX5243

## 2023-01-13 LAB — POCT PREGNANCY, URINE: Preg Test, Ur: NEGATIVE

## 2023-01-13 SURGERY — EXCISION, HIDRADENITIS, AXILLA
Anesthesia: General | Site: Axilla | Laterality: Left

## 2023-01-13 MED ORDER — MIDAZOLAM HCL 2 MG/2ML IJ SOLN
INTRAMUSCULAR | Status: AC
  Filled 2023-01-13: qty 2

## 2023-01-13 MED ORDER — CEFAZOLIN SODIUM-DEXTROSE 2-4 GM/100ML-% IV SOLN: 2.0000 g | INTRAVENOUS | Status: DC

## 2023-01-13 MED ORDER — GABAPENTIN 300 MG PO CAPS: 300.0000 mg | ORAL_CAPSULE | Freq: Three times a day (TID) | ORAL | 2 refills | Status: DC

## 2023-01-13 MED ORDER — ACETAMINOPHEN 500 MG PO TABS: 1000.0000 mg | ORAL_TABLET | Freq: Once | ORAL | Status: DC

## 2023-01-13 MED ORDER — LACTATED RINGERS IV SOLN: INTRAVENOUS | Status: DC

## 2023-01-13 MED ORDER — SCOPOLAMINE 1 MG/3DAYS TD PT72
1.0000 | MEDICATED_PATCH | Freq: Once | TRANSDERMAL | Status: DC
  Administered 2023-01-13: 1.5 mg via TRANSDERMAL

## 2023-01-13 MED ORDER — AMISULPRIDE (ANTIEMETIC) 5 MG/2ML IV SOLN: 10.0000 mg | Freq: Once | INTRAVENOUS | Status: DC | PRN

## 2023-01-13 MED ORDER — CEFAZOLIN IN SODIUM CHLORIDE 3-0.9 GM/100ML-% IV SOLN: 3.0000 g | INTRAVENOUS | Status: DC

## 2023-01-13 MED ORDER — OXYCODONE HCL 5 MG PO TABS
ORAL_TABLET | ORAL | Status: AC
  Filled 2023-01-13: qty 1

## 2023-01-13 MED ORDER — BUPIVACAINE-EPINEPHRINE (PF) 0.25% -1:200000 IJ SOLN
INTRAMUSCULAR | Status: AC
  Filled 2023-01-13: qty 30

## 2023-01-13 MED ORDER — MIDAZOLAM HCL 5 MG/5ML IJ SOLN
INTRAMUSCULAR | Status: DC | PRN
  Administered 2023-01-13: 2 mg via INTRAVENOUS

## 2023-01-13 MED ORDER — VANCOMYCIN HCL 1000 MG IV SOLR
INTRAVENOUS | Status: DC | PRN
  Administered 2023-01-13: 1000 mg via INTRAVENOUS

## 2023-01-13 MED ORDER — METHOCARBAMOL 750 MG PO TABS: 750.0000 mg | ORAL_TABLET | Freq: Three times a day (TID) | ORAL | 0 refills | Status: DC | PRN

## 2023-01-13 MED ORDER — SCOPOLAMINE 1 MG/3DAYS TD PT72
MEDICATED_PATCH | TRANSDERMAL | Status: AC
  Filled 2023-01-13: qty 1

## 2023-01-13 MED ORDER — LIDOCAINE HCL (PF) 1 % IJ SOLN
INTRAMUSCULAR | Status: AC
  Filled 2023-01-13: qty 30

## 2023-01-13 MED ORDER — GABAPENTIN 300 MG PO CAPS
300.0000 mg | ORAL_CAPSULE | ORAL | Status: AC
  Administered 2023-01-13: 300 mg via ORAL

## 2023-01-13 MED ORDER — OXYCODONE HCL 5 MG PO TABS: 5.0000 mg | ORAL_TABLET | Freq: Four times a day (QID) | ORAL | 0 refills | Status: AC | PRN

## 2023-01-13 MED ORDER — ACETAMINOPHEN 325 MG RE SUPP: 650.0000 mg | RECTAL | Status: DC | PRN

## 2023-01-13 MED ORDER — ACETAMINOPHEN 500 MG PO TABS
1000.0000 mg | ORAL_TABLET | ORAL | Status: AC
  Administered 2023-01-13: 1000 mg via ORAL

## 2023-01-13 MED ORDER — SUGAMMADEX SODIUM 200 MG/2ML IV SOLN
INTRAVENOUS | Status: DC | PRN
  Administered 2023-01-13: 250 mg via INTRAVENOUS

## 2023-01-13 MED ORDER — FENTANYL CITRATE (PF) 100 MCG/2ML IJ SOLN
INTRAMUSCULAR | Status: AC
  Filled 2023-01-13: qty 2

## 2023-01-13 MED ORDER — DEXAMETHASONE SODIUM PHOSPHATE 4 MG/ML IJ SOLN
INTRAMUSCULAR | Status: DC | PRN
  Administered 2023-01-13: 5 mg via INTRAVENOUS

## 2023-01-13 MED ORDER — GABAPENTIN 300 MG PO CAPS
ORAL_CAPSULE | ORAL | Status: AC
  Filled 2023-01-13: qty 1

## 2023-01-13 MED ORDER — CEFAZOLIN IN SODIUM CHLORIDE 3-0.9 GM/100ML-% IV SOLN
INTRAVENOUS | Status: AC
  Filled 2023-01-13: qty 100

## 2023-01-13 MED ORDER — HYDROMORPHONE HCL 1 MG/ML IJ SOLN: 0.2500 mg | INTRAMUSCULAR | Status: DC | PRN

## 2023-01-13 MED ORDER — ROCURONIUM BROMIDE 100 MG/10ML IV SOLN
INTRAVENOUS | Status: DC | PRN
  Administered 2023-01-13: 60 mg via INTRAVENOUS

## 2023-01-13 MED ORDER — 0.9 % SODIUM CHLORIDE (POUR BTL) OPTIME
TOPICAL | Status: DC | PRN
  Administered 2023-01-13: 100 mL

## 2023-01-13 MED ORDER — BACITRACIN ZINC 500 UNIT/GM EX OINT
TOPICAL_OINTMENT | CUTANEOUS | Status: AC
  Filled 2023-01-13: qty 28.35

## 2023-01-13 MED ORDER — ACETAMINOPHEN 500 MG PO TABS
ORAL_TABLET | ORAL | Status: AC
  Filled 2023-01-13: qty 2

## 2023-01-13 MED ORDER — BUPIVACAINE-EPINEPHRINE 0.25% -1:200000 IJ SOLN
INTRAMUSCULAR | Status: DC | PRN
  Administered 2023-01-13: 30 mL

## 2023-01-13 MED ORDER — OXYCODONE HCL 5 MG PO TABS
5.0000 mg | ORAL_TABLET | ORAL | Status: DC | PRN
  Administered 2023-01-13: 5 mg via ORAL

## 2023-01-13 MED ORDER — ACETAMINOPHEN 325 MG PO TABS: 650.0000 mg | ORAL_TABLET | ORAL | Status: DC | PRN

## 2023-01-13 MED ORDER — BUPIVACAINE HCL (PF) 0.25 % IJ SOLN
INTRAMUSCULAR | Status: AC
  Filled 2023-01-13: qty 30

## 2023-01-13 MED ORDER — PROPOFOL 10 MG/ML IV BOLUS
INTRAVENOUS | Status: DC | PRN
  Administered 2023-01-13: 200 mg via INTRAVENOUS

## 2023-01-13 MED ORDER — BACITRACIN ZINC 500 UNIT/GM EX OINT
TOPICAL_OINTMENT | CUTANEOUS | Status: DC | PRN
  Administered 2023-01-13: 1 via TOPICAL

## 2023-01-13 MED ORDER — LIDOCAINE HCL (CARDIAC) PF 100 MG/5ML IV SOSY
PREFILLED_SYRINGE | INTRAVENOUS | Status: DC | PRN
  Administered 2023-01-13: 100 mg via INTRAVENOUS

## 2023-01-13 MED ORDER — FENTANYL CITRATE (PF) 100 MCG/2ML IJ SOLN
INTRAMUSCULAR | Status: DC | PRN
  Administered 2023-01-13: 50 ug via INTRAVENOUS
  Administered 2023-01-13: 25 ug via INTRAVENOUS
  Administered 2023-01-13: 50 ug via INTRAVENOUS

## 2023-01-13 MED ORDER — EPHEDRINE SULFATE (PRESSORS) 50 MG/ML IJ SOLN
INTRAMUSCULAR | Status: DC | PRN
  Administered 2023-01-13 (×2): 10 mg via INTRAVENOUS

## 2023-01-13 MED ORDER — DOCUSATE SODIUM 100 MG PO CAPS: 100.0000 mg | ORAL_CAPSULE | Freq: Two times a day (BID) | ORAL | 0 refills | Status: AC

## 2023-01-13 MED ORDER — FENTANYL CITRATE (PF) 100 MCG/2ML IJ SOLN: 25.0000 ug | INTRAMUSCULAR | Status: DC | PRN

## 2023-01-13 SURGICAL SUPPLY — 56 items
ADH SKN CLS APL DERMABOND .7 (GAUZE/BANDAGES/DRESSINGS)
APL PRP STRL LF DISP 70% ISPRP (MISCELLANEOUS) ×1
BLADE SURG 10 STRL SS (BLADE) ×1 IMPLANT
BLADE SURG 15 STRL LF DISP TIS (BLADE) ×1 IMPLANT
BLADE SURG 15 STRL SS (BLADE) ×1
BNDG CMPR 5X4 CHSV STRCH STRL (GAUZE/BANDAGES/DRESSINGS) ×1
BNDG COHESIVE 4X5 TAN STRL LF (GAUZE/BANDAGES/DRESSINGS) ×1 IMPLANT
CANISTER SUCT 1200ML W/VALVE (MISCELLANEOUS) ×1 IMPLANT
CHLORAPREP W/TINT 26 (MISCELLANEOUS) ×1 IMPLANT
CLSR STERI-STRIP ANTIMIC 1/2X4 (GAUZE/BANDAGES/DRESSINGS) IMPLANT
COVER BACK TABLE 60X90IN (DRAPES) ×1 IMPLANT
COVER MAYO STAND STRL (DRAPES) ×1 IMPLANT
DERMABOND ADVANCED .7 DNX12 (GAUZE/BANDAGES/DRESSINGS) ×1 IMPLANT
DRAIN CHANNEL 15F RND FF W/TCR (WOUND CARE) IMPLANT
DRAPE TOP ARMCOVERS (MISCELLANEOUS) ×1 IMPLANT
DRAPE U-SHAPE 76X120 STRL (DRAPES) ×1 IMPLANT
DRAPE UTILITY XL STRL (DRAPES) ×1 IMPLANT
DRSG TEGADERM 4X4.75 (GAUZE/BANDAGES/DRESSINGS) IMPLANT
ELECT COATED BLADE 2.86 ST (ELECTRODE) ×1 IMPLANT
ELECT REM PT RETURN 9FT ADLT (ELECTROSURGICAL) ×1
ELECTRODE REM PT RTRN 9FT ADLT (ELECTROSURGICAL) ×1 IMPLANT
EVACUATOR SILICONE 100CC (DRAIN) IMPLANT
GAUZE PACKING IODOFORM 1/4X15 (PACKING) IMPLANT
GAUZE PAD ABD 8X10 STRL (GAUZE/BANDAGES/DRESSINGS) ×1 IMPLANT
GAUZE SPONGE 4X4 12PLY STRL LF (GAUZE/BANDAGES/DRESSINGS) IMPLANT
GAUZE XEROFORM 1X8 LF (GAUZE/BANDAGES/DRESSINGS) IMPLANT
GLOVE BIO SURGEON STRL SZ 6 (GLOVE) ×2 IMPLANT
GLOVE BIOGEL PI IND STRL 6.5 (GLOVE) ×1 IMPLANT
GOWN STRL REUS W/ TWL LRG LVL3 (GOWN DISPOSABLE) ×3 IMPLANT
GOWN STRL REUS W/TWL LRG LVL3 (GOWN DISPOSABLE) ×3
NDL HYPO 25X1 1.5 SAFETY (NEEDLE) ×1 IMPLANT
NEEDLE HYPO 25X1 1.5 SAFETY (NEEDLE) ×1 IMPLANT
NS IRRIG 1000ML POUR BTL (IV SOLUTION) ×1 IMPLANT
PACK BASIN DAY SURGERY FS (CUSTOM PROCEDURE TRAY) ×1 IMPLANT
PENCIL SMOKE EVACUATOR (MISCELLANEOUS) ×1 IMPLANT
PIN SAFETY STERILE (MISCELLANEOUS) IMPLANT
SHEET MEDIUM DRAPE 40X70 STRL (DRAPES) ×1 IMPLANT
SLEEVE SCD COMPRESS KNEE MED (STOCKING) ×1 IMPLANT
SPIKE FLUID TRANSFER (MISCELLANEOUS) IMPLANT
SPONGE T-LAP 18X18 ~~LOC~~+RFID (SPONGE) ×1 IMPLANT
STAPLER VISISTAT 35W (STAPLE) ×1 IMPLANT
STOCKINETTE IMPERVIOUS LG (DRAPES) ×1 IMPLANT
STRIP CLOSURE SKIN 1/2X4 (GAUZE/BANDAGES/DRESSINGS) IMPLANT
SUT ETHILON 2 0 FS 18 (SUTURE) IMPLANT
SUT MNCRL AB 3-0 PS2 18 (SUTURE) IMPLANT
SUT MNCRL AB 4-0 PS2 18 (SUTURE) ×1 IMPLANT
SUT MON AB 4-0 PC3 18 (SUTURE) IMPLANT
SUT MON AB 5-0 P3 18 (SUTURE) IMPLANT
SUT PDS AB 0 CT 36 (SUTURE) IMPLANT
SUT PDS AB 2-0 CT2 27 (SUTURE) IMPLANT
SUT PLAIN 5 0 P 3 18 (SUTURE) IMPLANT
SYR CONTROL 10ML LL (SYRINGE) ×1 IMPLANT
TOWEL GREEN STERILE FF (TOWEL DISPOSABLE) ×1 IMPLANT
TUBE CONNECTING 20X1/4 (TUBING) ×1 IMPLANT
UNDERPAD 30X36 HEAVY ABSORB (UNDERPADS AND DIAPERS) ×1 IMPLANT
YANKAUER SUCT BULB TIP NO VENT (SUCTIONS) ×1 IMPLANT

## 2023-01-13 NOTE — Anesthesia Preprocedure Evaluation (Addendum)
Anesthesia Evaluation  Patient identified by MRN, date of birth, ID band Patient awake    Reviewed: Allergy & Precautions, NPO status , Patient's Chart, lab work & pertinent test results  History of Anesthesia Complications Negative for: history of anesthetic complications  Airway Mallampati: II  TM Distance: >3 FB Neck ROM: Full    Dental  (+) Chipped,    Pulmonary neg pulmonary ROS   Pulmonary exam normal        Cardiovascular negative cardio ROS Normal cardiovascular exam     Neuro/Psych  Headaches, Seizures -, Well Controlled,     GI/Hepatic negative GI ROS, Neg liver ROS,,,  Endo/Other  negative endocrine ROS    Renal/GU negative Renal ROS     Musculoskeletal negative musculoskeletal ROS (+)    Abdominal   Peds  Hematology negative hematology ROS (+)   Anesthesia Other Findings Left axillary hidradenitis  Reproductive/Obstetrics                              Anesthesia Physical Anesthesia Plan  ASA: 2  Anesthesia Plan: General   Post-op Pain Management: Tylenol PO (pre-op)*   Induction: Intravenous  PONV Risk Score and Plan: 3 and Treatment may vary due to age or medical condition, Ondansetron, Dexamethasone, Midazolam and Scopolamine patch - Pre-op  Airway Management Planned: Oral ETT  Additional Equipment: None  Intra-op Plan:   Post-operative Plan: Extubation in OR  Informed Consent: I have reviewed the patients History and Physical, chart, labs and discussed the procedure including the risks, benefits and alternatives for the proposed anesthesia with the patient or authorized representative who has indicated his/her understanding and acceptance.     Dental advisory given  Plan Discussed with: CRNA  Anesthesia Plan Comments:         Anesthesia Quick Evaluation

## 2023-01-13 NOTE — Op Note (Signed)
Operative Note  Shannon Velasquez  [id number]  [id number]  01/13/2023   Surgeon: Phylliss Blakes MD FACS   Co-Surgeon: Glenna Fellows MD MBA   Procedure performed: Excision of left axillary hidradenitis, and subcutaneous tissue 15x15x3cm   Preop diagnosis: axillary hidradenitis  Post-op diagnosis/intraop findings: same   Specimens: left axillary hidradenitis Retained items: no  EBL: minimal cc Complications: none   Description of procedure: After obtaining informed consent the patient was taken to the operating room and placed supine on operating room table where general endotracheal anesthesia was initiated, preoperative antibiotics were administered, SCDs applied, and a formal timeout was performed.  The left axilla and chest wall/arm were prepped and draped in the usual sterile fashion. After infiltration with local, an incision was made encompassing all of the abnormal/chronically scarred regions in the left axilla. This was deepened with cautery and then continued cautery was used to excise the skin and underlying soft tissue, including all palpable and visible abnormal tissue. This does extended down to the muscle fascia along the biceps. The specimen was handed off and hemostasis was ensured with cautery. The wound was measured with dimensions above. The case was turned over to Dr. Leta Baptist at this point for flap coverage; please see her separate operative note.    All counts were correct at the completion of the case.

## 2023-01-13 NOTE — Anesthesia Procedure Notes (Signed)
Procedure Name: Intubation Date/Time: 01/13/2023 11:02 AM  Performed by: Ronnette Hila, CRNAPre-anesthesia Checklist: Patient identified, Emergency Drugs available, Suction available and Patient being monitored Patient Re-evaluated:Patient Re-evaluated prior to induction Oxygen Delivery Method: Circle system utilized Preoxygenation: Pre-oxygenation with 100% oxygen Induction Type: IV induction Ventilation: Mask ventilation without difficulty Laryngoscope Size: Mac Grade View: Grade I Tube type: Oral Number of attempts: 1 Airway Equipment and Method: Stylet and Oral airway Placement Confirmation: ETT inserted through vocal cords under direct vision, positive ETCO2 and breath sounds checked- equal and bilateral Secured at: 22 cm Tube secured with: Tape Dental Injury: Teeth and Oropharynx as per pre-operative assessment

## 2023-01-13 NOTE — H&P (Signed)
Subjective Patient ID: Shannon Velasquez is a 37 y.o. female.  HPI  Presents for excision left axillary hidradenitis by Dr. Fredricka Bonine and local flap closure. Patient has had history hidradenitis since age 55, affects axillae and intermittently buttock.  She has undergone prior right axillary excision 2023 with one episode recurrent boil. Denies any current drainage. No chronic medical management attempts per patient. Recently seen by Dermatology; per patient was counseled disease would return. She is not currently on any treatment for HS.  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 Lives with kids ages 5, 43. Mother in area to assist with post operative care.  Review of Systems  Objective Physical Exam Cardiovascular: Normal rate, regular rhythm and normal heart sounds.  Pulmonary/Chest Effort normal and breath sounds normal.  Skin  Fitzpatrick 6  MS: Hurley 3 disease left axilla Right axilla healed scar  Assessment/Plan  Hidradenitis axillaris  Reviewed pathophysiology HS and this will be chronic disease for her. Medical management means chronic antibiotics topical and possibly biologic use.  Surgical treatment to prevent recurrence involves excision hair bearing areas of disease. This means extensive excision, open wound. Trearment open wound axillae inlcude options heal by secondary intention, STSG, or local flap from chest wall. Quoted 100% wound healing issues with each of these options and none will address the appearance of scars that she does not like, will create additional scars. If she pursues surgery recommend one axilla at a time, reviewed would unlikely be able to work at her current position for several weeks, and would need assistance post op with wound care and care of her family. Reviewed all share risks contracture scar as does uncontrolled HS disease itself.  Additional risks including but not limited to bleeding, hematoma, seroma, infection, damage to adjacent structures  need to additional procedures blood clots in legs or lungs reviewed.  Glenna Fellows, MD [facility]  Office/ physician access line after hours [phone]

## 2023-01-13 NOTE — Discharge Instructions (Addendum)
  Post Anesthesia Home Care Instructions  Activity: Get plenty of rest for the remainder of the day. A responsible individual must stay with you for 24 hours following the procedure.  For the next 24 hours, DO NOT: -Drive a car -Advertising copywriter -Drink alcoholic beverages -Take any medication unless instructed by your physician -Make any legal decisions or sign important papers.  Meals: Start with liquid foods such as gelatin or soup. Progress to regular foods as tolerated. Avoid greasy, spicy, heavy foods. If nausea and/or vomiting occur, drink only clear liquids until the nausea and/or vomiting subsides. Call your physician if vomiting continues.  Special Instructions/Symptoms: Your throat may feel dry or sore from the anesthesia or the breathing tube placed in your throat during surgery. If this causes discomfort, gargle with warm salt water. The discomfort should disappear within 24 hours.  If you had a scopolamine patch placed behind your ear for the management of post- operative nausea and/or vomiting:  1. The medication in the patch is effective for 72 hours, after which it should be removed.  Wrap patch in a tissue and discard in the trash. Wash hands thoroughly with soap and water. 2. You may remove the patch earlier than 72 hours if you experience unpleasant side effects which may include dry mouth, dizziness or visual disturbances. 3. Avoid touching the patch. Wash your hands with soap and water after contact with the patch.  No tylenol until after 3:45 today if needed.

## 2023-01-13 NOTE — Transfer of Care (Signed)
Immediate Anesthesia Transfer of Care Note  Patient: SHANNAN SLINKER  Procedure(s) Performed: EXCISION HIDRADENITIS LEFT AXILLA (Left: Axilla) ADJACENT TISSUE TRANSFER LEFT CHEST TO LEFT AXILLA 100-150 CM (Left: Axilla)  Patient Location: PACU  Anesthesia Type:General  Level of Consciousness: awake, drowsy, and patient cooperative  Airway & Oxygen Therapy: Patient Spontanous Breathing and Patient connected to face mask oxygen  Post-op Assessment: Report given to RN and Post -op Vital signs reviewed and stable  Post vital signs: Reviewed and stable  Last Vitals:  Vitals Value Taken Time  BP    Temp    Pulse    Resp    SpO2      Last Pain:  Vitals:   01/13/23 0938  TempSrc: Oral  PainSc: 0-No pain      Patients Stated Pain Goal: 1 (01/13/23 1610)  Complications: No notable events documented.

## 2023-01-13 NOTE — Interval H&P Note (Signed)
History and Physical Interval Note:  01/13/2023 10:35 AM  Shannon Velasquez  has presented today for surgery, with the diagnosis of left axillary hidradenitis.  The various methods of treatment have been discussed with the patient and family. After consideration of risks, benefits and other options for treatment, the patient has consented to  Procedure(s): EXCISION HIDRADENITIS LEFT AXILLA (Left) ADJACENT TISSUE TRANSFER LEFT CHEST TO LEFT AXILLA 100-150 CM (Left) as a surgical intervention.  The patient's history has been reviewed, patient examined, no change in status, stable for surgery.  I have reviewed the patient's chart and labs.  Questions were answered to the patient's satisfaction.     Jackie Russman Lollie Sails

## 2023-01-13 NOTE — Op Note (Signed)
Operative Note   DATE OF OPERATION: 6.25.2024  LOCATION: [facility] outpatient  SURGICAL DIVISION: Plastic Surgery  PREOPERATIVE DIAGNOSES:  1. Hidradenitis left axilla  POSTOPERATIVE DIAGNOSES:  same  PROCEDURE:  Adjacent tissue transfer left chest to left axilla 240 cm2  SURGEON: Glenna Fellows MD MBA  ASSISTANT: none  ANESTHESIA:  General.   EBL: 15 ml  COMPLICATIONS: None immediate.   INDICATIONS FOR PROCEDURE:  The patient, Shannon Velasquez, is a 37 y.o. female born on [birth year], is here for excision and local flap closure left axillary hidradenitis.   FINDINGS: Following resection hidradenitis, open wound 12 x 10 cm present.   DESCRIPTION OF PROCEDURE:  The patient's operative site was marked with the patient in the preoperative area. The patient was taken to the operating room. SCDs were placed and IV antibiotics were given. The patient's operative site was prepped and draped in a sterile fashion. A time out was performed and all information was confirmed to be correct. Refer to Dr. Fredricka Bonine report for details of resection.Following resection, open wound 12 x 10 cm noted. An inferiorly and posteriorly based rhomboid flap was designed. The flap was sharply incised and this was carried through superficial fascia. The flap was elevated in subfascial plane until tension free rotation into defect achieved. The wound irrigated and hemostasis ensured.15 Fr JP placed in base wound and secured with 2-0 nylon. The donor site was closed primarily with 2-0 PDS  interrupted in superficial fascia and 3-0 monocryl in dermis. The flap was rotated superiorly and skin trimmed to defect. The flap was inset with 2-0 and 0 PDS  interrupted in superficial fascia and 3-0 monocryl in dermis. Total area of flap and donor site 240 cm2. Skin closure of flap and donor site completed with 3-0 and 4-0 monocryl short running. Antibiotic ointment and dry dressing applied.    The patient was allowed to  wake from anesthesia, extubated and taken to the recovery room in satisfactory condition.   SPECIMENS: none  DRAINS: 15 Fr JP in left subcutaneous tissue  Glenna Fellows, MD [name]  Office/ physician access line after hours [phone]

## 2023-01-13 NOTE — Anesthesia Postprocedure Evaluation (Signed)
Anesthesia Post Note  Patient: Shannon Velasquez  Procedure(s) Performed: EXCISION HIDRADENITIS LEFT AXILLA (Left: Axilla) ADJACENT TISSUE TRANSFER LEFT CHEST TO LEFT AXILLA 100-150 CM (Left: Axilla)     Patient location during evaluation: PACU Anesthesia Type: General Level of consciousness: awake and alert Pain management: pain level controlled Vital Signs Assessment: post-procedure vital signs reviewed and stable Respiratory status: spontaneous breathing, nonlabored ventilation, respiratory function stable and patient connected to nasal cannula oxygen Cardiovascular status: blood pressure returned to baseline and stable Postop Assessment: no apparent nausea or vomiting Anesthetic complications: no   No notable events documented.  Last Vitals:  Vitals:   01/13/23 1345 01/13/23 1415  BP: 110/86 106/84  Pulse: 78 68  Resp: 18 18  Temp:  (!) 36.2 C  SpO2: 96% 100%    Last Pain:  Vitals:   01/13/23 1415  TempSrc:   PainSc: 6                  Trevor Iha

## 2023-01-14 ENCOUNTER — Encounter (HOSPITAL_BASED_OUTPATIENT_CLINIC_OR_DEPARTMENT_OTHER): Payer: Self-pay | Admitting: Surgery

## 2023-01-14 LAB — SURGICAL PATHOLOGY

## 2023-02-09 NOTE — Progress Notes (Signed)
 Subjective Patient ID: Shannon Velasquez is a 37 y.o. female.   HPI  4 w post op. Developed dehiscence incision current wound care silver alginate. Notes less drainage.  She has undergone prior right axillary excision 2023 with one episode recurrent boil. She is not currently on any medical treatment for HS.   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 Mother in area to assist with post operative care.  Review of Systems  Objective Physical Exam MS:  Left axilla:  distal inset of flap open wound 9 x 5  x 0.1 cm 100% granulated.  Donor site flap open wound 3 x 3 x 0.1 cm Inset flap to proxmal arm dehisced  Removed sutures texposed  Treated areas with silver nitrate No cellulitis    Assessment/Plan  Hidradenitis axillaris s/p left axillary excision local flap closure  Continue silver alginate to wounds daily. Leave open to air at home. Overall no increase in size from last visit and areas demonstrating epithelization. Reviewed once less inflammation soft tissue present can reassess for closure wound partial if needed. May benefit from this at inset of flap to proximal arm- will reserve time to to this next visit in office under local if appropriate. Provided dressing supplies.    F/u-

## 2023-02-16 NOTE — Progress Notes (Signed)
 Subjective Patient ID: Shannon Velasquez is a 37 y.o. female.   HPI  5 w post op. Developed dehiscence incision current wound care silver alginate.   She has undergone prior right axillary excision 2023 with one episode recurrent boil. She is not currently on any medical treatment for HS.   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 Lives with kids ages 71, 55. Mother in area to assist with post operative care.  Review of Systems  Objective Physical Exam MS:  Left axilla:  distal inset of flap open wound 8.5 x 4.55  x 0.1 cm 100% granulated.  Donor site flap open wound 3 x 2 x 0.1 cm Inset flap to proxmal arm dehisced  Removed sutures exposed  Treated areas with silver nitrate No cellulitis    Assessment/Plan  Hidradenitis axillaris s/p left axillary excision local flap closure  Wound progressing. Change to collagen wound care daily or every other day. Leave open to air at home.  Reviewed once less inflammation soft tissue present can reassess for closure wound partial if needed. May benefit from this at inset of flap to proximal arm- will reserve time to to this next week in office under local if appropriate, but depth of this portion wound significant contracted since last visit, may not be necessary. Provided dressing supplies.    Has scheduled follow up

## 2023-02-23 NOTE — Progress Notes (Signed)
 Subjective Patient ID: Shannon Velasquez is a 37 y.o. female.   HPI  6 w post op. Developed dehiscence incision current wound care collagen, started last week.   She has undergone prior right axillary excision 2023 with one episode recurrent boil. She is not currently on any medical treatment for HS.   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 Mother in area to assist with post operative care.  Review of Systems  Objective Physical Exam MS:  Left axilla:  distal inset of flap open wound 5.5 x 3.5  x 0.1 cm 100% granulated.  Donor site flap open wound 1 x 1 x 0.1 cm Inset flap to proxmal arm dehisced  Removed sutures exposed  Treated areas with silver nitrate No cellulitis    Assessment/Plan  Hidradenitis axillaris s/p left axillary excision local flap closure  Wound progressing. Continue to collagen wound care daily or every other day. Leave open to air at home.  Has made progress over last few weeks and no plan for closure in office of any of the areas.  F/u 2 w. Ok to return to work letter provided.

## 2023-03-09 NOTE — Progress Notes (Signed)
 Subjective Patient ID: Shannon Velasquez is a 37 y.o. female.   HPI  8 w post op. Developed dehiscence incision current wound care collagen.   She has undergone prior right axillary excision 2023 with one episode recurrent boil. She is not currently on any medical treatment for HS.   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 Lives with kids ages 37, 12. Mother in area to assist with post operative care.  Review of Systems  Objective Physical Exam MS:  Left axilla:  distal inset of flap open wound 3.5 x 1.5  x 0.1 cm 100% granulated.  Donor site flap wound healed Inset flap to proxmal arm dehisced -nearly healed  Treated areas with silver nitrate No cellulitis scant drainage    Assessment/Plan  Hidradenitis axillaris s/p left axillary excision local flap closure  Wound progressing. Continue to collagen wound care daily or every other day. Leave open to air at home.  Provided supplies. F/u 2 w.   Pictures next visit.

## 2023-03-25 NOTE — Progress Notes (Signed)
 Subjective Patient ID: Shannon Velasquez is a 36 y.o. female.   HPI  10 w post op. Developed dehiscence incision current wound care collagen.   She has undergone prior right axillary excision 2023 with one episode recurrent boil. She is not currently on any medical treatment for HS.   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 Lives with kids ages 78, 46. Mother in area to assist with post operative care.  Review of Systems  Objective Physical Exam MS:  Left axilla:  distal inset of flap open wound 0.7 x 2 x 0.1 cm 100% granulated.  Abduction limited to 90-100 degrees  Treated area with silver nitrate No cellulitis scant drainage   Assessment/Plan  Hidradenitis axillaris s/p left axillary excision local flap closure  Wound progressing. Continue to collagen wound care daily or every other day. Leave open to air at home.  Provided supplies.   Pictures today. Refer to PT.   F/u 3 w

## 2023-04-15 NOTE — Progress Notes (Signed)
 Subjective Patient ID: Shannon Velasquez is a 37 y.o. female.   HPI  3 mo post op. Developed dehiscence incision current wound care collagen. She was referred to PT after last visit- appt scheduled for 9.30.24. After last visit called to report bleeding and we counseled to hold off on her second job at [employer] to limit activities.  She has undergone prior right axillary excision 2023 with one episode recurrent boil. She is not currently on any medical treatment for HS.   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 Lives with kids ages 74, 101. Mother in area to assist with post operative care.  Review of Systems  Objective Physical Exam MS:  Left axilla:  distal inset of flap open wound 1.0 x 4 x 0.1 cm 100% granulated.  Abduction limited to 90-100 degrees  Treated area with silver nitrate No cellulitis scant drainage   Assessment/Plan  Hidradenitis axillaris s/p left axillary excision local flap closure  Wound has increased in size. Provided new letter to remain out of work at [employer] until wound further healed. Continue to collagen wound care daily or every other day. Leave open to air at home.  Provided supplies.    F/u 4 w

## 2023-04-20 ENCOUNTER — Telehealth: Payer: Self-pay

## 2023-04-20 ENCOUNTER — Ambulatory Visit: Payer: Medicaid Other | Attending: Plastic Surgery

## 2023-04-20 NOTE — Telephone Encounter (Signed)
Tried calling pt about missed appt. Went  straight to [employer]. Left message and  advised pt to call [phone] if she needs to reschedule and to talk with MD regarding PT if she has any questions about attending due to wound healing.

## 2023-06-04 NOTE — Progress Notes (Signed)
 Subjective Patient ID: Shannon Velasquez is a 37 y.o. female.   HPI  5 mo post op. Last seen 03/2023. Developed dehiscence incision current wound care none as she reports healed. Previously referred to PT, per chart review patient no show.  She has undergone prior right axillary excision 2023 with one episode recurrent boil. She is not currently on any medical treatment for HS.   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 Mother in area to assist with post operative care. 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  Review of Systems  Objective Physical Exam MS:  Left axilla:  all areas healed Abduction near full AROM    Assessment/Plan  Hidradenitis axillaris s/p left axillary excision local flap closure  Picture taken. Wound healed. ROM near normal. Patient is interested in breast reduction and may make appt for this.

## 2023-07-22 NOTE — L&D Delivery Note (Signed)
 [facility] Delivery Note  Shannon Velasquez is a 38 y.o. [id number] s/p SVD at [redacted]w[redacted]d. She was admitted for SROM.   ROM: 6h 60m with reportedly clear fluid GBS Status: Negative/-- (08/26 0000)     Labor Progress: Initial SVE: 3/50/-2. She then progressed to complete.   Delivery Date/Time: 03/20/2024 at 0745 Delivery: Called to room and patient was complete and pushing. Head delivered Direct OP. No nuchal cord present. Shoulder and body delivered in usual fashion. Infant with spontaneous cry, placed on mother's abdomen, dried and stimulated. Cord clamped x 2 after 1-minute delay, and cut by mother of baby.   Pitocin  started. Gentle cord traction applied. Cord began to elongate and placenta membrane with chorionic plate vessels, but with majority of the placenta still in the uterus. Attempted manual sweep but unable to get into uterus due to clamping of lower uterine segment. Dr. Herchel called to attempt uterine sweep. Epidural not fully set in so patient feeling discomfort. Anesthesia called to attempt to redose the epidural as well as provide nitroglycerin . See separate note from Dr. Herchel. Patient taken to OR to remove retained placenta fragments.   Labia, perineum, vagina, and cervix inspected inspected with 2nd degree perineal laceration identified.   Baby Weight: pending  Placenta: Sent to L&D Complications: Avulsed placenta with retained fragments requiring OR Lacerations: 2nd degree perineal, not repaired, to be repaired in the OR.  EBL: 600 mL Analgesia: Epidural   Infant:  APGAR (1 MIN): 9  APGAR (5 MINS): 9

## 2023-08-09 ENCOUNTER — Emergency Department (HOSPITAL_COMMUNITY)
Admission: EM | Admit: 2023-08-09 | Discharge: 2023-08-09 | Discharge status: Against Medical Advice | Payer: Medicaid Other

## 2023-08-09 NOTE — ED Notes (Signed)
Pt decided to leave before going to triage.

## 2023-08-16 ENCOUNTER — Emergency Department (HOSPITAL_BASED_OUTPATIENT_CLINIC_OR_DEPARTMENT_OTHER)
Admission: EM | Admit: 2023-08-16 | Discharge: 2023-08-16 | Disposition: A | Discharge status: Home / Self Care | Payer: Medicaid Other | Attending: Emergency Medicine | Admitting: Emergency Medicine

## 2023-08-16 ENCOUNTER — Encounter (HOSPITAL_BASED_OUTPATIENT_CLINIC_OR_DEPARTMENT_OTHER): Payer: Self-pay

## 2023-08-16 ENCOUNTER — Other Ambulatory Visit: Payer: Self-pay

## 2023-08-16 DIAGNOSIS — R059 Cough, unspecified: Secondary | ICD-10-CM | POA: Diagnosis present

## 2023-08-16 DIAGNOSIS — J069 Acute upper respiratory infection, unspecified: Secondary | ICD-10-CM | POA: Diagnosis not present

## 2023-08-16 DIAGNOSIS — Z20822 Contact with and (suspected) exposure to covid-19: Secondary | ICD-10-CM | POA: Insufficient documentation

## 2023-08-16 LAB — RESP PANEL BY RT-PCR (RSV, FLU A&B, COVID)  RVPGX2
Influenza A by PCR: NEGATIVE
Influenza B by PCR: NEGATIVE
Resp Syncytial Virus by PCR: NEGATIVE
SARS Coronavirus 2 by RT PCR: NEGATIVE

## 2023-08-16 NOTE — ED Triage Notes (Signed)
Reports URI symptoms: sore throat, congestion, cough, diarrhea since yesterday. Denies [facility] or chest pain

## 2023-08-16 NOTE — Progress Notes (Signed)
 [facility]  CHIEF COMPLAINT: Chief Complaint  Patient presents with  . New Patient  . Pregnancy confirmation     HPI: Shannon Velasquez presents today for presented patient is unsure of conception date but believes LMP is 1127.  PLAN/DISCUSSED: 1) ultrasound today reveals a gestational sac with a very small indistinct yolk sac.  No fetal heart tones were seen but there was a flank flicker which might be early fetal heart tones.  Will bring her back in 1 to 2 weeks for her first OB visit as well as next ultrasound.  All questions were answered.  Patient will go ahead and start prenatal vitamins.  We went through her past medical past surgical history.  ALLERGIES: Allergies  Allergen Reactions  . Bupropion Other (See Comments)    Has seizures  . Penicillins Other (See Comments)    From when younger, Has patient had a PCN reaction causing immediate rash, facial/tongue/throat swelling, SOB or lightheadedness with hypotension: No, Has patient had a PCN reaction causing severe rash involving mucus membranes or skin necrosis: No, Has patient had a PCN reaction that required hospitalization: no, Has patient had a PCN reaction occurring within the last 10 years: no, If all of the above answers are NO, then may proceed with Cephalosporin use.    MEDICATIONS: Current Outpatient Medications on File Prior to Visit  Medication Sig Dispense Refill  . Prenatal Vitamin 27 mg iron- 0.8 mg tablet TAKE 1 TABLET BY MOUTH ONCE DAILY FOR PREGNANCY     No current facility-administered medications on file prior to visit.    PAST MEDICAL HISTORY: Past Medical History:  Diagnosis Date  . Bartholin cyst 03/05/2016    Left sided Present for ~1 year Word cath placed twice by Dr Carlene and lanced once by Dr Carlene She desired definitive treatment.  Moved to resection of bartholin's cyst  . Epilepsy (CMD)   . 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 Morbid obesity with BMI of 45.0-49.9, adult (CMD)  11/10/2022  . Seizures (CMD)     PAST SURGICAL HISTORY: Past Surgical History:  Procedure Laterality Date  . AXILLARY HIDRADENITIS EXCISION Right 2023  . AXILLARY HIDRADENITIS EXCISION Left 01/13/2023   with local flap closure  . BARTHOLIN GLAND CYST EXCISION     Procedure: BARTHOLIN GLAND CYST EXCISION    OBSTETRIC HISTORY:  [id number]  GYNECOLOGIC HISTORY:     FAMILY HISTORY:  family history includes Breast cancer in her maternal grandmother.  SOCIAL HISTORY: Social History   Socioeconomic History  . Marital status: Single    Spouse name: Not on file  . Number of children: Not on file  . Years of education: Not on file  . Highest education level: Not on file  Occupational History  . Not on file  Tobacco Use  . Smoking status: Never    Passive exposure: Never  . Smokeless tobacco: Never  Substance and Sexual Activity  . Alcohol use: No  . Drug use: No  . Sexual activity: Not on file  Other Topics Concern  . Not on file  Social History Narrative  . Not on file   Social Drivers of Health   Food Insecurity: Not on file  Transportation Needs: Not on file  Safety: Not on file  Living Situation: Not on file    REVIEW OF SYSTEMS:  GENERAL: no fevers, chills, significant changes in weight  HEENT: no changes in vision or hearing, no rhinorrhea or cough BREAST: no pain, masses or discharge RESPIRATORY: no cough,  increased work of breathing or shortness of breath CARDIOVASCULAR: no chest pain or palpitations GASTROINTESTINAL: no N/V/D, no abdominal or pelvic pain, regular bowel movements GENITOURINARY: no dysuria, no vaginal discharge, no bowel or bladder incontinence NEUROLOGICAL: no headaches, memory loss or weakness MUSCULOSKELETAL: no bone or joint pain SKIN: no rashes or lesions PSYCHIATRY: no changes in mood, no anxiety or depression.  AUTOIMMUNE/HEMEONC: No problems reported  PHYSICAL EXAM: BP 130/80   Ht 1.6 m (5' 3)   Wt 116 kg (256 lb 12.8 oz)    LMP 06/18/2023   BMI 45.49 kg/m  Body mass index is 45.49 kg/m.  GENERAL: Well appearing female.  Appears her stated age.  Alert + oriented.  In NAD.   A chaperone was present for the exam portion of the visit  I have personally spent 35 minutes involved in face-to-face and non-face-to-face activities for this patient on the day of the visit.  Professional time spent includes the following activities, in addition to those noted in the documentation:  -reviewing today's ultrasound, past pregnancies in preparation for the patient's visit,  -reviewing medications and allergies in preparation,  -in counseling and educating the patient and/or family/caregiver if present,  -in ordering medications, test, or procedures,  -and in documentation after patient has checked out from office.    Electronically signed by: Ozell Dallas Milks, MD 08/16/2023 4:02 PM

## 2023-08-16 NOTE — ED Provider Notes (Signed)
[place] [facility] AT [facility] Provider Note   CSN: [id number] Arrival date & time: 08/16/23  1002     History  Chief Complaint  Patient presents with   URI    Shannon Velasquez is a 38 y.o. female.  38 yo pregnant ([redacted]w[redacted]d) F presenting to ER with a 1-day history of sore throat and dry cough. Pt began feeling sick yesterday around the same time as her daughter. She has not had any fevers and is tolerating PO intake. Pt endorses nausea but denies vomiting, however attributes this to her pregnancy. Pt is otherwise feel well. Pt denies any chest pain, SOB, abdominal pain, or fatigue. 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   URI      Home Medications Prior to Admission medications   Medication Sig Start Date End Date Taking? Authorizing Provider  gabapentin (NEURONTIN) 300 MG capsule Take 1 capsule (300 mg total) by mouth 3 (three) times daily. 01/13/23 01/13/24  Berna Bue, MD  methocarbamol (ROBAXIN-750) 750 MG tablet Take 1 tablet (750 mg total) by mouth every 8 (eight) hours as needed for muscle spasms (pain). 01/13/23   Berna Bue, MD  prochlorperazine (COMPAZINE) 10 MG tablet Take 1 tablet (10 mg total) by mouth 2 (two) times daily as needed for nausea or vomiting (and headache). 10/09/22   Tegeler, Canary Brim, MD      Allergies    Penicillins and Wellbutrin [bupropion]    Review of Systems   Review of Systems  All other systems reviewed and are negative.   Physical Exam Updated Vital Signs BP 108/76   Pulse 84   Temp 98.2 F (36.8 C) (Oral)   Resp 18   Ht 5\' 3"  (1.6 m)   Wt 108.9 kg   LMP 12/22/2022 (Exact Date)   SpO2 96%   BMI 42.51 kg/m  Physical Exam Vitals and nursing note reviewed.  Constitutional:      Appearance: She is well-developed.  HENT:     Head: Normocephalic.  Cardiovascular:     Rate and Rhythm: Normal rate.  Pulmonary:     Effort: Pulmonary effort is normal.   Abdominal:     General: There is no distension.  Musculoskeletal:        General: Normal range of motion.     Cervical back: Normal range of motion.  Skin:    General: Skin is warm.  Neurological:     General: No focal deficit present.     Mental Status: She is alert and oriented to person, place, and time.     ED Results / Procedures / Treatments   Labs (all labs ordered are listed, but only abnormal results are displayed) Labs Reviewed  RESP PANEL BY RT-PCR (RSV, FLU A&B, COVID)  [id number]    EKG None  Radiology No results found.  Procedures Procedures    Medications Ordered in ED Medications - No data to display  ED Course/ Medical Decision Making/ A&P                                 Medical Decision Making Patient complains of a cough and congestion that started today  Amount and/or Complexity of Data Reviewed Labs: ordered. Decision-making details documented in ED Course.    Details: Labs ordered reviewed and interpreted flu COVID and RSV are negative  Risk Risk Details: Patient  advised Tylenol for symptoms.  She is here with a child who has a positive influenza test patient is advised to monitor for symptoms return if any problems.           Final Clinical Impression(s) / ED Diagnoses Final diagnoses:  Viral URI with cough    Rx / DC Orders ED Discharge Orders     None     An After Visit Summary was printed and given to the patient.     Elson Areas, [place] 08/16/23 1816    Terald Sleeper, MD 08/18/23 [phone]

## 2023-08-16 NOTE — Discharge Instructions (Signed)
Return if any problems.

## 2023-09-01 LAB — OB RESULTS CONSOLE RPR: RPR: NONREACTIVE

## 2023-09-01 LAB — OB RESULTS CONSOLE GC/CHLAMYDIA
Chlamydia: NEGATIVE
Neisseria Gonorrhea: NEGATIVE

## 2023-09-01 LAB — OB RESULTS CONSOLE HEPATITIS B SURFACE ANTIGEN: Hepatitis B Surface Ag: NEGATIVE

## 2023-09-01 LAB — OB RESULTS CONSOLE RUBELLA ANTIBODY, IGM: Rubella: IMMUNE

## 2023-09-01 LAB — OB RESULTS CONSOLE HIV ANTIBODY (ROUTINE TESTING): HIV: NONREACTIVE

## 2023-09-01 LAB — HEPATITIS C ANTIBODY: HCV Ab: NEGATIVE

## 2023-09-01 NOTE — Progress Notes (Signed)
 Reviewed ultrasound.  Good interval growth.  Fetal heart rate 185.  Cervix long and closed.  Handout given on panorama/Hor[facility]  Discussed expected weight gain/exercise during pregnancy.  Acog reviewed--AMA  1. Encounter for other specified antenatal screening  Prenatal Testing, Blood Bank   CBC without Differential   HIV Screen with Reflex to Confirmation   Rapid Plasma Reagin (RPR), Qualitative Test with Reflex to Titer and Confirmation   Hepatitis B Surface Antigen (HBSAG) Screen, Qualitative With Confirmation   Hepatitis C Virus (HCV) Antibody Screen With Confirmation   Chlamydia / Gonococcus (GC), NAAT   Urine Culture   Rubella Antibody, IgG   Varicella Zoster Virus (VZV) Antibody, IgG   Hemoglobinopathy Fractionation Profile with Reflex Confirmation   TSH   Protein, Total, Random Urine   Comprehensive Metabolic Panel    2. [redacted] weeks gestation of pregnancy  Prenatal Testing, Blood Bank   CBC without Differential   HIV Screen with Reflex to Confirmation   Rapid Plasma Reagin (RPR), Qualitative Test with Reflex to Titer and Confirmation   Hepatitis B Surface Antigen (HBSAG) Screen, Qualitative With Confirmation   Hepatitis C Virus (HCV) Antibody Screen With Confirmation   Chlamydia / Gonococcus (GC), NAAT   Urine Culture   Rubella Antibody, IgG   Varicella Zoster Virus (VZV) Antibody, IgG   Hemoglobinopathy Fractionation Profile with Reflex Confirmation   TSH   Protein, Total, Random Urine   Comprehensive Metabolic Panel    3. First trimester screening  US  OB Less than 14 Wks Single Transabd    4. Multigravida of advanced maternal age in first trimester      5. Maternal morbid obesity, antepartum (CMD)       Return in about 4 weeks (around 09/29/2023) for OB visit, first trim u/s, 1hr GTT, ?panorama/horizon.

## 2023-09-01 NOTE — Progress Notes (Signed)
 NOB and u/s no complaints  Urine culture sent    Pap 08/21/22 at [facility]   Provided patient with basic prenatal breastfeeding education to prepare for feeding baby after delivery. Counseled patient on the following below. Encouraged patient to reach out if she has additional questions or concerns about breastfeeding.  Patient also encouraged to ask her nurse and lactation consultant in the hospital for breastfeeding support after delivery.   Importance of Breastfeeding Breast milk has antibodies and nutrition that help protect baby from sickness and diseases, such as allergies, ear infections, SIDS and obesity.  For mom breast milk lowers risk for breast and ovarian cancers, Type 2 Diabetes (high blood sugar), heart disease and postpartum depression.   Importance of Immediate and Sustained Skin-to-Skin Skin to skin keeps baby warm and helps with bonding and milk supply.   Skin to skin helps baby adjust to life outside the womb Releases hormones that help with breastfeeding Put baby skin to skin right after birth, throughout hospital stay and as much as possible at home.  Early Initiation of Breastfeeding Baby to breast within the first hour of delivery Early milk is colostrum that is very rich in nutrition, high in antibodies that protect your baby against germs and bacteria. Rooming in on 24 hour Basis Baby remains in the same room with you while you are in the hospital Rooming in helps you learn your newborn. Rooming in helps your breastfeeding routine.  It allows you to start breastfeeding early and often Feeding on Cue, On Demand or Baby-led Feeding Avoid focusing on the clock and focus on hunger cues.    Hunger cues are things your baby does to let you know it is time to nurse (licking lips, smacking and turning head and opening mouth). Frequent Feeding  A well-nourished baby nurses 8-12 times a day.  Helps with building a good milk supply, weight gain, blood sugar stabilization  and long-term breastfeeding. Good Positioning and Latch Breastfeeding should not be painful.   The correct latch should not hurt; if it is painful, it is incorrect. Good latch means baby's mouth is wide open and as much as the breast as possible in the baby's mouth.   Forming a correct latch may take some practice Good positioning helps form a good latch and provide comfort to both mom and baby Exclusive Breastfeeding for the first 6 months and the Importance of Breastfeeding after 6 months and Introduction of Solids. When breastfeeding you do not have to give your baby anything else. Do not give water, formula, juice or solids Breastfeeding is still important even after you start solid foods Continue to feed your baby breast milk for the first 37 months of age and as long as you wish.   Risk of giving formula or other breast-milk substitutes For breastfeeding success, avoid formula or breastmilk substitutes, unless prescribed by your doctor. Using formula or nipples can  Interrupt the amount of milk produced Make it harder for baby to suckle Lower stimulation of breasts  Have a negative effect on baby's normal digestion.

## 2023-09-29 NOTE — Progress Notes (Signed)
 Rob  [place]  No concerns

## 2023-09-30 NOTE — Telephone Encounter (Signed)
 [redacted]w[redacted]d pregnant, states at yesterday's ultrasound someone said she had bleeding behind the placenta. Patient inquiring if she needs to do anything different related to this.  This RN unable to see ultrasound report at present.

## 2023-09-30 NOTE — Telephone Encounter (Signed)
 Subchorionic hemorrhage noted on ultrasound. No vaginal bleeding yesterday. Reviewed with patient in detail at office visit. Informed her that we do not know why this occurs-usually the body absorbs it on his own and things work out okay for the pregnancy. Occasionally, it can increase the risk of miscarriage; however, most of these pregnancies do okay. Counseled patient on calling us  if she gets any bright red bleeding. Informed patient yesterday that she may get some brown discharge, she may not.  But would notify us  if bright red bleeding. I already scheduled short-term follow-up in 2 weeks with repeat ultrasound when she was seen yesterday. JMW

## 2023-09-30 NOTE — Telephone Encounter (Signed)
 Patient called, name and date of birth verified. Provider message reviewed with patient. Voices understanding. Denies questions at this time.

## 2023-10-08 NOTE — Telephone Encounter (Addendum)
 Pt 13wks 4d. Pt identified Pt states,I woke up this morning with lower back, hip, and right leg pain 8/10. It feels like sharp, shooting pain at times. Pt does not feel slept wrong and no injury has occurred. Pt denies pain in right calf, warm to touch, tenderness, or reddened areas in either calf. Denies UTI sx's and BM's are normal. Denies vag bleeding, watery d/c, abd pain, cxts, or fever. Pt informed can take OTC Tylenol  Reg or XS, use warm heating pad/compress, warm tub bath, and rest. Appt sched 10/09/2023 @ 3:30p for further eval. Pt to go to ED if any sx's of DVT per above occurs. Pt voices understanding.

## 2023-10-09 NOTE — Progress Notes (Signed)
 [facility] New Patient Visit Shannon Velasquez DOB: [birth year]  MRN: [id number] Visit Date: 10/09/2023  Encounter Provider: Manuelita Murray Pizza, MD   Subjective:  Cc:  Chief Complaint  Patient presents with  . Routine Prenatal Visit   Shannon Velasquez is a 38 y.o. female who presents today for evaluation and treatment of right lower back pain which radiates into the right flank and then anteriorly over the right hip.  This been going on for about 24 hours.  It is not severe.  She is having no bleeding or spotting.  Objective:  BP 110/66   Wt 116 kg (256 lb 12.8 oz)   LMP 06/18/2023   BMI 45.49 kg/m  Patient Active Problem List   Diagnosis Date Noted  Date Diagnosed  . Maternal morbid obesity, antepartum (CMD) 09/01/2023   . Pregnancy 09/01/2023     Overview Note:    EDD confirmed by 8wk u/s. EDD 04/10/2024 NIPS low risk  Pregravid BMI 46 plan growth u/s.     SABRA AMA (advanced maternal age) multigravida 35+ 09/01/2023   . 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 Morbid obesity with BMI of 45.0-49.9, adult (CMD) 11/10/2022   . Epilepsy (CMD) 10/23/2022     Overview Note:    Last seizure age 63.  No meds for the last 60yrs.       Resolved Problems   Diagnosis Date Noted Date Resolved Date Diagnosed  . Postoperative examination 09/23/2016 11/10/2022     Overview Note:    Excision of L bartholin's gland 09/18/16 Extensive dissection requiring 2 day hospital stay for pain control POD6- Since discharge, she has been active at home. Incision line has  opened completely. No infection. Continued edema, but decreasing. D/w pt.  Will allow secondary healing. Encourage decreased activity and wound care.  POD9- Swelling continues to decrease. Again discussed open incision and  plans for secondary healing. Allow baths.  POD14- swelling resolved. Continue sitz baths. Continue secondary healing.  POD36- well healed. No further postop care necessary       Past Medical History:  Diagnosis Date  . Bartholin cyst 03/05/2016    Left sided Present for ~1 year Word cath placed twice by Dr Carlene and lanced once by Dr Carlene She desired definitive treatment.  Moved to resection of bartholin's cyst  . Epilepsy (CMD)   . 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 Morbid obesity with BMI of 45.0-49.9, adult (CMD) 11/10/2022  . Seizures (CMD)     Past Surgical History:  Procedure Laterality Date  . AXILLARY HIDRADENITIS EXCISION Right 2023  . AXILLARY HIDRADENITIS EXCISION Left 01/13/2023   with local flap closure  . BARTHOLIN GLAND CYST EXCISION     Procedure: BARTHOLIN GLAND CYST EXCISION      Allergies  Allergen Reactions  . Bupropion Other (See Comments)    Has seizures  . Penicillins Other (See Comments)    From when younger, Has patient had a PCN reaction causing immediate rash, facial/tongue/throat swelling, SOB or lightheadedness with hypotension: No, Has patient had a PCN reaction causing severe rash involving mucus membranes or skin necrosis: No, Has patient had a PCN reaction that required hospitalization: no, Has patient had a PCN reaction occurring within the last 10 years: no, If all of the above answers are NO, then may proceed with Cephalosporin use.      Social History   Social History Narrative  . Not on file    Family History  Problem Relation  Name Age of Onset  . Breast cancer Maternal Grandmother Armed forces logistics/support/administrative officer   . Diabetes Paternal Grandmother Izora Peguese     Review of Systems A 14-point ROS was performed with pertinent positives/negatives noted in the HPI. The remainder of the ROS are negative.  Physical Exam  Constitutional: She is well groomed. She appears well-developed.  Psychiatric: She has a normal mood and affect. Her behavior is normal. Judgment and thought content normal.   CVAT: Bilateral.   Labs: Recent Results (from the past week)  POC Glucose and Protein, Urine   Collection Time:  10/09/23  3:56 PM  Result Value Ref Range   Protein, Urine Negative Negative mg/dL   Glucose, Urine Negative Negative mg/dL    Assessment/Plan:   1. Encounter for other specified antenatal screening  POC Glucose and Protein, Urine    2. Acute right-sided low back pain without sciatica      3. Right lower quadrant pain      The patient has the above which is probably secondary to posture changes during the pregnancy.  She will take Tylenol  and do stretching exercises.  If she does not improve any she will let us  know.  I have personally spent 20 minutes involved in face-to-face and non-face-to-face activities for this patient on the day of the visit.  Professional time spent includes the following activities, in addition to those noted in the documentation: -reviewing her medications, allergies, her medical, surgical, obstetric, gynecologic, and family history in preparation for the patient's visit, -reviewing the chart in preparation, -in counseling and educating the patient and/or family/caregiver if present, -in ordering medications, test, or procedures, -and in documentation after patient has checked out from office.    Orders Placed This Encounter  Procedures  . POC Glucose and Protein, Urine    Requested Prescriptions    No prescriptions requested or ordered in this encounter    This record has been created using Dragon voice recognition software. Errors have been sought and corrected, but may not always be located. Such creation errors do not reflect on the standard of medical care   Electronically signed by: Manuelita Murray Pizza, MD 10/09/2023 4:54 PM

## 2023-10-11 NOTE — Progress Notes (Signed)
 [facility] Patient: Shannon Velasquez DOB: [birth year] DOS: 09/29/2023 [facility]: [id number]  CC: Routine OB visit HPI: Ms. Zuelke is a 38 y.o. G3P2002 at [redacted]w[redacted]d who presents for routine prenatal care. Here for u/s and NIPS. REVIEW OF SYSTEMS:  Negative except as in HPI PMH/PSH/Family Hx/Social Hx/Meds/Allergies: reviewed and updated today.  PHYSICAL EXAM: BP 116/66   Wt 116 kg (256 lb 3.2 oz)   LMP 06/18/2023   BMI 45.38 kg/m   Gen: well developed, well appearing, no acute distress Abd: omitted d/t u/s ULTRASOUND: Viable 12 wk fetus with FHTs 163 BPM; +NB seen. 4 cm [facility] noted. Uterine fibroids.   ASSESSMENT AND PLAN: 1. [redacted] weeks gestation of pregnancy  POC Glucose and Protein, Urine   POC Glucose Tolerance Test (GTT 1 Hour)    2. Uterine fibroids affecting pregnancy in first trimester      3. Subchorionic hematoma in first trimester, single or unspecified fetus       Reviewed u/s with pt. Nasal bone (NB) seen which is reassuring. Good FHTs 163. Also noted 4 cm [facility], uterine fibroid. Has not had any vag bleeding. Unknown cause of SCH.   ++ recommend repeat u/s in 2 wks to check subchorionic hemorrhage / also need to know SIZE and LOCATION of uterine fibroids - need to know if fibroids in lower uterine segment.   Electronically signed by: Kate Bellman, MD 10/11/2023 8:47 PM

## 2023-10-13 NOTE — Progress Notes (Signed)
 BrieflyLanai, Shannon Velasquez [id number] at [redacted]w[redacted]d Estimated Date of Delivery: 04/10/24  Ultrasound today with resolution of subchorionic hemorrhage. Discussed fibroids. Discussed spinal carrier status.  Encourage partner to be tested.  He will come with her next visit or at 20 weeks for testing.  BP 106/62   Wt 117 kg (258 lb)   LMP 06/18/2023   BMI 45.70 kg/m  Fundal Height- not measured due to recent ultrasound Fetal Heart Tones- present     Problem List Items Addressed This Visit       Pregnancy   Uterine fibroids affecting pregnancy   Overview   [redacted]w[redacted]d- 5.5cm left intramural/subserosal.  2.5 cm anterior intramural.  2.8 cm posterior right intramural.      Subchorionic hematoma in first trimester   Overview   Noted at 12 weeks. Resolved by 14 weeks. Fetal surveillance per maternal obesity protocol      Pregnancy   Overview   Dating by 8wk u/s. Estimated Date of Delivery: 04/10/24 Normal NT and NIPS, female Early 1hGTT- 87   Pregravid BMI 46 plan growth u/s.        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  Recommend father the baby be tested as well.      AMA (advanced maternal age) multigravida 35+   Overview   Fetal surveillance per maternal obesity protocol      Other Visit Diagnoses       Encounter for other specified antenatal screening    -  Primary   Relevant Orders   POC Glucose and Protein, Urine        Labor signs, pregnancy warning signs, and fetal movement counting reviewed (if applicable). All questions were answered.   Return in about 2 weeks (around 10/27/2023) for Prenatal visit, AFP.

## 2023-10-13 NOTE — Telephone Encounter (Signed)
 Pt seen today in office for an appt @ 2:30p for u/s and visit with Dr. Lang. Genetic testing results discussed with pt at that time.

## 2023-10-17 ENCOUNTER — Emergency Department (HOSPITAL_COMMUNITY)
Admission: EM | Admit: 2023-10-17 | Discharge: 2023-10-17 | Disposition: A | Discharge status: Home / Self Care | Attending: Emergency Medicine | Admitting: Emergency Medicine

## 2023-10-17 ENCOUNTER — Encounter (HOSPITAL_COMMUNITY): Payer: Self-pay | Admitting: *Deleted

## 2023-10-17 ENCOUNTER — Other Ambulatory Visit: Payer: Self-pay

## 2023-10-17 DIAGNOSIS — K0889 Other specified disorders of teeth and supporting structures: Secondary | ICD-10-CM

## 2023-10-17 DIAGNOSIS — K029 Dental caries, unspecified: Secondary | ICD-10-CM | POA: Diagnosis not present

## 2023-10-17 MED ORDER — ACETAMINOPHEN 325 MG PO TABS
650.0000 mg | ORAL_TABLET | Freq: Once | ORAL | Status: AC
  Administered 2023-10-17: 650 mg via ORAL
  Filled 2023-10-17: qty 2

## 2023-10-17 NOTE — Discharge Instructions (Signed)
 Your examination showed no signs of infection at this time.  I recommend taking Tylenol for pain due to to your pregnancy.  You may contact your OB/GYN for further pain control recommendations.  I have attached contact information for the on-call dentistry provider.  You may contact them to schedule a follow-up appointment.

## 2023-10-17 NOTE — ED Triage Notes (Signed)
 Dental pain all day she is [redacted] weeks pregnant  lmp nov

## 2023-10-17 NOTE — ED Provider Notes (Signed)
 [place] EMERGENCY DEPARTMENT AT [facility] Provider Note   CSN: [id number] Arrival date & time: 10/17/23  0221     History  Chief Complaint  Patient presents with   Dental Pain    Shannon Velasquez is a 38 y.o. female.  Patient with past medical history significant for epilepsy, hidradenitis, migraines, currently 14 weeks and 4 days pregnant presents to the emergency department complaining of lower left-sided dental pain.  She states her pain has been ongoing for 1 day.  She denies nausea, vomiting, trouble swallowing, fevers.  She endorses a broken tooth in the area of the dental pain.  She does not currently have a dentist.   Dental Pain      Home Medications Prior to Admission medications   Medication Sig Start Date End Date Taking? Authorizing Provider  gabapentin (NEURONTIN) 300 MG capsule Take 1 capsule (300 mg total) by mouth 3 (three) times daily. 01/13/23 01/13/24  Berna Bue, MD  methocarbamol (ROBAXIN-750) 750 MG tablet Take 1 tablet (750 mg total) by mouth every 8 (eight) hours as needed for muscle spasms (pain). 01/13/23   Berna Bue, MD  prochlorperazine (COMPAZINE) 10 MG tablet Take 1 tablet (10 mg total) by mouth 2 (two) times daily as needed for nausea or vomiting (and headache). 10/09/22   Tegeler, Canary Brim, MD      Allergies    Penicillins and Wellbutrin [bupropion]    Review of Systems   Review of Systems  Physical Exam Updated Vital Signs BP 118/81 (BP Location: Right Arm)   Pulse 87   Temp 98.4 F (36.9 C) (Oral)   Resp 18   Ht 5\' 3"  (1.6 m)   Wt 108.9 kg   LMP 12/22/2022 (Exact Date)   SpO2 100%   BMI 42.53 kg/m  Physical Exam HENT:     Head: Normocephalic and atraumatic.     Mouth/Throat:     Dentition: Abnormal dentition. Dental tenderness and dental caries present. No gingival swelling, dental abscesses or gum lesions.   Eyes:     Conjunctiva/sclera: Conjunctivae normal.  Cardiovascular:     Rate and  Rhythm: Normal rate.  Pulmonary:     Effort: Pulmonary effort is normal. No respiratory distress.  Musculoskeletal:        General: No signs of injury.     Cervical back: Normal range of motion.  Skin:    General: Skin is dry.  Neurological:     Mental Status: She is alert.  Psychiatric:        Speech: Speech normal.        Behavior: Behavior normal.     ED Results / Procedures / Treatments   Labs (all labs ordered are listed, but only abnormal results are displayed) Labs Reviewed - No data to display  EKG None  Radiology No results found.  Procedures Procedures    Medications Ordered in ED Medications  acetaminophen (TYLENOL) tablet 650 mg (650 mg Oral Given 10/17/23 0425)    ED Course/ Medical Decision Making/ A&P                                 Medical Decision Making Risk OTC drugs.   This patient presents to the ED for concern of dental pain, this involves an extensive number of treatment options, and is a complaint that carries with it a high risk of complications and morbidity.  The differential diagnosis  includes broken tooth, abscess, dental cavity, others   Co morbidities that complicate the patient evaluation  Epilepsy   Additional history obtained:   External records from outside source obtained and reviewed including OB/GYN notes    Problem List / ED Course / Critical interventions / Medication management   I ordered medication including Tylenol for pain Reevaluation of the patient after these medicines showed that the patient improved I have reviewed the patients home medicines and have made adjustments as needed   Social Determinants of Health:  Patient has Medicaid for her primary health insurance type   Test / Admission - Considered:  Patient with a broken tooth in the lower left with no signs of infection or abscess.  Plan to recommend patient follow-up with dentistry for further evaluation.  Tylenol recommended for pain  control due to patient's pregnancy.  Patient may follow-up with OB/GYN for further pain control recommendations.  Return precautions provided.         Final Clinical Impression(s) / ED Diagnoses Final diagnoses:  Pain, dental    Rx / DC Orders ED Discharge Orders     None         Pamala Duffel 10/17/23 0446    Nira Conn, MD 10/17/23 [phone]

## 2023-10-27 NOTE — Progress Notes (Signed)
 ROB  Ketones-neg  Would like to know why she didn't have US  for fibroids.

## 2023-10-27 NOTE — Progress Notes (Signed)
 Shannon Velasquez [phone] at [redacted]w[redacted]d Estimated Date of Delivery: 04/10/24 BP 112/72   Wt 118 kg (261 lb 3.2 oz)   LMP 06/18/2023   BMI 46.27 kg/m   CC:  Pregnancy at approximately 16 weeks.  HPI:  No pain, no contractions, no LOF and no bleeding.  ROS: See Flow Sheet.  Exam: VSS, Pt is in no acute distress, abd soft/NT.  A/P: v28.9, v22.1 Prenatal Visit.  Pt is here for her 16 week visit.  We discussed the risks, benefits, and statistics of CF testing in great detail.  The genetics, testing, and implications of CF were discussed.  The CF test was performed if the pt desired CF testing.   MSAFP was performed.  All questions were answered.  Next visit is the 20wk anatomy U/S.   Problem List Items Addressed This Visit       Gynecologic and Obstetric Problems   Uterine fibroids affecting pregnancy (CMD)   Overview   [redacted]w[redacted]d- 5.5cm left intramural/subserosal.  2.5 cm anterior intramural.  2.8 cm posterior right intramural.        Other   Morbid obesity with BMI of 45.0-49.9, adult (CMD)   Maternal morbid obesity, antepartum    (CMD)   Overview   Early 1 hour GTT-87 Monthly ultrasound to start at 28 weeks 28-week ultrasound- 32-week ultrasound- 36-week ultrasound-      Pregnancy (CMD) - Primary   Overview   Dating by 8wk u/s. Estimated Date of Delivery: 04/10/24 Normal NT and NIPS, female Early 1hGTT- 87   Pregravid BMI 46 plan growth u/s.        AMA (advanced maternal age) multigravida 35+ (CMD)   Overview   Fetal surveillance per maternal obesity protocol       1) no issues today.  We discussed fibroids during pregnancy.  Discussed that we will check these again at her anatomy visit in 4 weeks.  No follow-ups on file.

## 2023-11-09 NOTE — Progress Notes (Signed)
 ROB  Ketones-neg  Patient is concerned about the pain she is having in her lower abdomin.

## 2023-11-16 NOTE — Telephone Encounter (Signed)
 Updated work note

## 2023-11-23 NOTE — Progress Notes (Signed)
 [facility] Patient: Shannon Velasquez DOB: Sep 16, 1985 DOS: 11/09/2023 [facility]: [id number]  CC: Routine OB visit HPI: Ms. Bellavance is a 38 y.o. G3P2002 at 18 weeks who presents for routine prenatal care. She's noticed a pain in her lower abd.  REVIEW OF SYSTEMS:  Negative except as in HPI PMH/PSH/Family Hx/Social Hx/Meds/Allergies: reviewed and updated today.  PHYSICAL EXAM: BP 114/70   Wt 118 kg (260 lb 6.4 oz)   LMP 06/18/2023   BMI 46.13 kg/m   Gen: well developed, well appearing, no acute distress Abd: fundus is NT, 2FB below umb. +FHTs. No point tenderness..   ASSESSMENT AND PLAN: 1. [redacted] weeks gestation of pregnancy (CMD)  POC Glucose and Protein, Urine    2. Abdominal pain, unspecified abdominal location  POC Urinalysis Auto without Microscopic   CANCELED: POC Urinalysis with Microscopic (Auto)     Suspect pain is either round lig pain or pain related to enlarging uterus. FHTs are reassuring today. U/a did not show any nitrites so doubt UTI. Advised to call if worsens.  F/u 2 wks for anat u/s  Electronically signed by: Kate Bellman, MD 11/23/2023 12:40 AM

## 2023-11-24 NOTE — Progress Notes (Signed)
 Return OB.  Reports good fetal movement.  Denies complaints.    Protein negative, ketones negative, glucose negative

## 2023-11-24 NOTE — Progress Notes (Signed)
 [facility] Patient: Shannon Velasquez DOB: 01-May-1986 DOS: 11/24/2023 [facility]: [id number]  CC: Routine OB visit HPI: Shannon Velasquez is a 38 y.o. G3P2002 at [redacted]w[redacted]d who presents for routine prenatal care. Here for u/s. +FM. REVIEW OF SYSTEMS:  Negative except as in HPI PMH/PSH/Family Hx/Social Hx/Meds/Allergies: reviewed and updated today.  PHYSICAL EXAM: BP 100/60   Wt 118 kg (260 lb 9.6 oz)   LMP 06/18/2023   BMI 46.16 kg/m   Gen: well developed, well appearing, no acute distress Abd: omitted d/t u/s ULTRASOUND: Placenta is right lateral; survey incomplete  ASSESSMENT AND PLAN: 1. [redacted] weeks gestation of pregnancy (CMD)      2. Encounter for other specified antenatal screening (CMD)  POC Glucose and Protein, Urine    3. Leiomyoma of uterus affecting pregnancy in second trimester (CMD)      4. Multigravida of advanced maternal age in second trimester (CMD)       Reviewed today's u/s with patient. Placenta is located right lateral. Survey is incomplete. 4 cm anterior intramural fibroid noted. Will plan on repeat u/s 4 wks.   Electronically signed by: Kate Bellman, MD 12/13/2023 11:07 PM

## 2023-12-23 NOTE — Progress Notes (Signed)
 Routine ob and u/s ketones negative good fm no complaints

## 2023-12-23 NOTE — Progress Notes (Signed)
 Briefly, Lonell Riding [phone] at [redacted]w[redacted]d Doing well. No complaints   Reviewed u/s.  Good interval growth.  Heart anatomy incomplete. Will repeat at 28wks if not able to complete will need fetal echo.     FULL NOTE:  Assessment Problem List Items Addressed This Visit   None Visit Diagnoses       Encounter for other specified antenatal screening (CMD)    -  Primary       HPI This is a 38 y.o.  G3P2002 at [redacted]w[redacted]d for return OB visit. Her Estimated Due Date is 04/10/2024, by Ultrasound  Denies leaking of fluid, vaginal bleeding, or contractions. Reports fetal movement.   Objective Vitals:   12/23/23 1352  BP: 116/74   General: well developed, well nourished, in no acute distress HEENT: normocephalic and atraumatic, mucous membranes moist, EOMI Resp: even and unlabored Psych: Normal mood and affect. All responses appropriate. Fundal Height-deferred d/t u/s Fetal Heart Tones- present 148  No follow-ups on file.  All questions were answered.   [name] 12/23/2023 2:18 PM

## 2024-01-04 ENCOUNTER — Inpatient Hospital Stay (HOSPITAL_COMMUNITY)
Admission: AD | Admit: 2024-01-04 | Discharge: 2024-01-04 | Disposition: A | Discharge status: Home / Self Care | Attending: Obstetrics and Gynecology | Admitting: Obstetrics and Gynecology

## 2024-01-04 ENCOUNTER — Encounter (HOSPITAL_COMMUNITY): Payer: Self-pay | Admitting: *Deleted

## 2024-01-04 DIAGNOSIS — Z711 Person with feared health complaint in whom no diagnosis is made: Secondary | ICD-10-CM | POA: Diagnosis not present

## 2024-01-04 DIAGNOSIS — O368120 Decreased fetal movements, second trimester, not applicable or unspecified: Secondary | ICD-10-CM | POA: Insufficient documentation

## 2024-01-04 DIAGNOSIS — Z3A25 25 weeks gestation of pregnancy: Secondary | ICD-10-CM | POA: Diagnosis not present

## 2024-01-04 DIAGNOSIS — Z3492 Encounter for supervision of normal pregnancy, unspecified, second trimester: Secondary | ICD-10-CM

## 2024-01-04 HISTORY — DX: Urinary tract infection, site not specified: N39.0

## 2024-01-04 HISTORY — DX: Trichomoniasis, unspecified: A59.9

## 2024-01-04 NOTE — MAU Note (Addendum)
 Shannon Velasquez is a 38 y.o. at [redacted]w[redacted]d here in MAU reporting: baby wasn't moving this morning, since she called clinic, now has felt a little movement. Denies bleeding or leaking.  Reports low back ache, ongoing issue.  Onset of complaint: this morning Pain score: mild Vitals:   01/04/24 1005 01/04/24 1009  BP:  109/68  Pulse:  91  Resp:  17  Temp:  98.4 F (36.9 C)  SpO2: 99%      WRU:EAVW movement now, direct to rm Lab orders placed from triage:

## 2024-01-04 NOTE — MAU Provider Note (Signed)
 History     CSN: [id number]  Arrival date and time: 01/04/24 4782   Event Date/Time   First Provider Initiated Contact with Patient 01/04/24 1020      Chief Complaint  Patient presents with   Decreased Fetal Movement   Shannon Velasquez , a  38 y.o. G3P2002 at [redacted]w[redacted]d presents to MAU with complaints of decreased fetal movement. Patient states that she had not felt baby move in 4-5 hours. Reports attempting to eat a honeybun to get her to move with minimal improvement. She does report some movement in the car on the way over to the hospital, but states that it was not enough. She denies vaginal bleeding, leaking of fluid or contractions. She has no other complaints.          OB History     Gravida  3   Para  2   Term  2   Preterm      AB      Living  2      SAB      IAB      Ectopic      Multiple      Live Births  2           Past Medical History:  Diagnosis Date   Epilepsy (HCC)    last seizure age 81 years   Left axillary hidradenitis    Trichomonas infection    UTI (urinary tract infection)     Past Surgical History:  Procedure Laterality Date   ADJACENT TISSUE TRANSFER/TISSUE REARRANGEMENT Left 01/13/2023   Procedure: ADJACENT TISSUE TRANSFER LEFT CHEST TO LEFT AXILLA 100-150 CM;  Surgeon: Alger Infield, MD;  Location: [facility];  Service: Plastics;  Laterality: Left;   BARTHOLIN CYST MARSUPIALIZATION     HYDRADENITIS EXCISION Right 04/28/2022   Procedure: EXCISION HIDRADENITIS RIGHT AXILLA;  Surgeon: Adalberto Acton, MD;  Location: [facility];  Service: General;  Laterality: Right;   HYDRADENITIS EXCISION Left 01/13/2023   Procedure: EXCISION HIDRADENITIS LEFT AXILLA;  Surgeon: Adalberto Acton, MD;  Location: [facility];  Service: General;  Laterality: Left;    Family History  Problem Relation Age of Onset   Hypertension Mother    Hypertension Father    Sickle cell anemia Maternal Aunt    Cancer Paternal  Uncle    Diabetes Maternal Grandmother    Breast cancer Maternal Grandmother    Diabetes Paternal Grandmother     Social History   Tobacco Use   Smoking status: Never   Smokeless tobacco: Never  Vaping Use   Vaping status: Never Used  Substance Use Topics   Alcohol use: No   Drug use: No    Allergies:  Allergies  Allergen Reactions   Penicillins Other (See Comments)    From when younger Has patient had a PCN reaction causing immediate rash, facial/tongue/throat swelling, SOB or lightheadedness with hypotension: No Has patient had a PCN reaction causing severe rash involving mucus membranes or skin necrosis: No Has patient had a PCN reaction that required hospitalization: no Has patient had a PCN reaction occurring within the last 10 years: no If all of the above answers are NO, then may proceed with Cephalosporin use.    Wellbutrin [Bupropion] Other (See Comments)    Has seizures    Medications Prior to Admission  Medication Sig Dispense Refill Last Dose/Taking   Prenatal Vit-Fe Fumarate-FA (MULTIVITAMIN-PRENATAL) 27-0.8 MG TABS tablet Take 1 tablet by mouth daily at 12  noon.   01/04/2024 Morning   gabapentin  (NEURONTIN ) 300 MG capsule Take 1 capsule (300 mg total) by mouth 3 (three) times daily. 90 capsule 2    methocarbamol  (ROBAXIN -750) 750 MG tablet Take 1 tablet (750 mg total) by mouth every 8 (eight) hours as needed for muscle spasms (pain). 20 tablet 0    prochlorperazine  (COMPAZINE ) 10 MG tablet Take 1 tablet (10 mg total) by mouth 2 (two) times daily as needed for nausea or vomiting (and headache). 10 tablet 0     Review of Systems  Constitutional:  Negative for chills, fatigue and fever.  Eyes:  Negative for pain and visual disturbance.  Respiratory:  Negative for apnea, shortness of breath and wheezing.   Cardiovascular:  Negative for chest pain and palpitations.  Gastrointestinal:  Negative for abdominal pain, constipation, diarrhea, nausea and vomiting.   Genitourinary:  Negative for difficulty urinating, dysuria, pelvic pain, vaginal bleeding, vaginal discharge and vaginal pain.  Musculoskeletal:  Negative for back pain.  Neurological:  Negative for seizures, weakness and headaches.  Psychiatric/Behavioral:  Negative for suicidal ideas.    Physical Exam   Blood pressure 109/68, pulse 91, temperature 98.4 F (36.9 C), temperature source Oral, resp. rate 17, height 5' 3.5 (1.613 m), weight 119.7 kg, last menstrual period 12/22/2022, SpO2 99%.  Physical Exam Vitals and nursing note reviewed.  Constitutional:      General: She is not in acute distress.    Appearance: Normal appearance.  HENT:     Head: Normocephalic.  Pulmonary:     Effort: Pulmonary effort is normal.  Abdominal:     Palpations: Abdomen is soft.     Tenderness: There is no abdominal tenderness.     Comments: Fetal movement noted on palpation    Musculoskeletal:     Cervical back: Normal range of motion.   Skin:    General: Skin is warm and dry.     Capillary Refill: Capillary refill takes 2 to 3 seconds.   Neurological:     Mental Status: She is alert and oriented to person, place, and time.   Psychiatric:        Mood and Affect: Mood normal.    FHT: 150 bpm with moderate variability, accels present no decels (appropriate for gestational age)  Toco: Quiet   MAU Course  Procedures - Patient given a fetal kick count clicker.   MDM - Fetal movement present on exam.  - NST reactive reassuring for gestational age  - Patient hit fetal clicker >10 times in MAU  - plan for discharge.   Assessment and Plan   1. Movement of fetus present during pregnancy in second trimester   2. [redacted] weeks gestation of pregnancy   3. Physically well but worried    - Reviewed fetal movement expectations at this gestational age.  - Also discussed interventions that can be performed at home to incite movement.  - Reviewed worsening signs and when to return to MAU  - FHT  appropriate for gestational age at time of discharge.  - Patient discharged home in stable condition and may return to MAU as needed.   Corie Diamond, MSN CNM  01/04/2024, 10:20 AM

## 2024-01-08 ENCOUNTER — Inpatient Hospital Stay (HOSPITAL_COMMUNITY)
Admission: AD | Admit: 2024-01-08 | Discharge: 2024-01-08 | Disposition: A | Discharge status: Home / Self Care | Payer: Self-pay | Attending: Obstetrics and Gynecology | Admitting: Obstetrics and Gynecology

## 2024-01-08 ENCOUNTER — Other Ambulatory Visit: Payer: Self-pay

## 2024-01-08 ENCOUNTER — Encounter (HOSPITAL_COMMUNITY): Payer: Self-pay | Admitting: Obstetrics and Gynecology

## 2024-01-08 DIAGNOSIS — Z3689 Encounter for other specified antenatal screening: Secondary | ICD-10-CM | POA: Diagnosis not present

## 2024-01-08 DIAGNOSIS — O26892 Other specified pregnancy related conditions, second trimester: Secondary | ICD-10-CM

## 2024-01-08 DIAGNOSIS — Z3A26 26 weeks gestation of pregnancy: Secondary | ICD-10-CM

## 2024-01-08 DIAGNOSIS — O09522 Supervision of elderly multigravida, second trimester: Secondary | ICD-10-CM | POA: Diagnosis present

## 2024-01-08 LAB — URINALYSIS, ROUTINE W REFLEX MICROSCOPIC
Bacteria, UA: NONE SEEN
Bilirubin Urine: NEGATIVE
Glucose, UA: NEGATIVE mg/dL
Hgb urine dipstick: NEGATIVE
Ketones, ur: NEGATIVE mg/dL
Leukocytes,Ua: NEGATIVE
Nitrite: NEGATIVE
Protein, ur: 30 mg/dL — AB
Specific Gravity, Urine: 1.029 (ref 1.005–1.030)
pH: 6 (ref 5.0–8.0)
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
## 2024-01-08 NOTE — Discharge Instructions (Signed)
 Follow up with your OB Provider  Return to MAU if any Vaginal bleeding, abdominal cramping, decreased fetal movements

## 2024-01-20 NOTE — Progress Notes (Signed)
 R/t ob visit.  Fetal movement Good  No complaints. None Protein negative, ketones small, glucose negative

## 2024-01-20 NOTE — Progress Notes (Signed)
 Shannon Velasquez [phone] at [redacted]w[redacted]d Estimated Date of Delivery: 04/10/24 BP 120/70   Wt 117 kg (257 lb 12.8 oz)   LMP 06/18/2023   BMI 45.67 kg/m   CC:  Pregnancy at approximately 28 weeks.  HPI:  No pain, no contractions, no LOF and no bleeding.  ROS: See Flow Sheet.  Exam: VSS, Pt is in no acute distress, abd soft/NT.  A/P: v28.9, v22.1 Prenatal Visit.  Pt is here for her 28 week visit.  28 week labs were performed today.  We reviewed PTL s/s today.  All questions were answered.  Problem List Items Addressed This Visit     Pregnancy (CMD)   Overview   Dating by 8wk u/s. Estimated Date of Delivery: 04/10/24 Normal NT and NIPS, female Early 1hGTT- 87 24wks--EFW 56%ile, AC 31%ile, incomplete for heart AA, DA  Pregravid BMI 46 plan growth u/s.        Other Visit Diagnoses       Encounter for other specified antenatal screening (CMD)    -  Primary       1) Growth is 49%.  Check again in 4 weeks  No follow-ups on file.

## 2024-01-21 ENCOUNTER — Other Ambulatory Visit: Payer: Self-pay

## 2024-01-21 ENCOUNTER — Encounter (HOSPITAL_COMMUNITY): Payer: Self-pay | Admitting: Family Medicine

## 2024-01-21 ENCOUNTER — Inpatient Hospital Stay (HOSPITAL_COMMUNITY)
Admission: AD | Admit: 2024-01-21 | Discharge: 2024-01-21 | Disposition: A | Discharge status: Home / Self Care | Payer: Self-pay | Attending: Family Medicine | Admitting: Family Medicine

## 2024-01-21 DIAGNOSIS — N949 Unspecified condition associated with female genital organs and menstrual cycle: Secondary | ICD-10-CM | POA: Diagnosis not present

## 2024-01-21 DIAGNOSIS — O26893 Other specified pregnancy related conditions, third trimester: Secondary | ICD-10-CM

## 2024-01-21 DIAGNOSIS — Z3A28 28 weeks gestation of pregnancy: Secondary | ICD-10-CM

## 2024-01-21 DIAGNOSIS — R102 Pelvic and perineal pain: Secondary | ICD-10-CM | POA: Insufficient documentation

## 2024-01-21 LAB — URINALYSIS, ROUTINE W REFLEX MICROSCOPIC
Bilirubin Urine: NEGATIVE
Glucose, UA: NEGATIVE mg/dL
Hgb urine dipstick: NEGATIVE
Ketones, ur: 20 mg/dL — AB
Nitrite: NEGATIVE
Protein, ur: NEGATIVE mg/dL
Specific Gravity, Urine: 1.011 (ref 1.005–1.030)
pH: 7 (ref 5.0–8.0)

## 2024-01-21 NOTE — MAU Note (Signed)
..  Shannon Velasquez is a 38 y.o. at [redacted]w[redacted]d here in MAU reporting: lower abdominal pain that began last night , the pain hurts more when she walks and feels like pressure.  Denies vaginal bleeding or leaking of fluid. +FM.  Did not take anything for the pain.  Pain score: 7/10 Vitals:   01/21/24 0613  BP: 111/66  Pulse: 88  Resp: 17  Temp: 98.4 F (36.9 C)  SpO2: 100%     FHT:155 Lab orders placed from triage:  UA

## 2024-01-21 NOTE — MAU Provider Note (Signed)
 Chief Complaint:  No chief complaint on file.   Event Date/Time   First Provider Initiated Contact with Patient 01/21/24 [phone]     HPI  HPI: Shannon Velasquez is a 38 y.o. G3P2002 at 66w4dwho presents to maternity admissions reporting sharp intermittent pain in bilateral lower sides of abdomen.  States felt fine in bed then when she got up to bathroom, had pain over round ligaments (this is where she indicates pain was).. Pain stopped when she layed down on bed.  No tightening. Was concerned it was preterm labor.  Did not call her OB providers in [facility]. . She reports good fetal movement, denies LOF, vaginal bleeding, urinary symptoms, or fever/chills.    RN Note: Shannon Velasquez is a 38 y.o. at [redacted]w[redacted]d here in MAU reporting: lower abdominal pain that began last night , the pain hurts more when she walks and feels like pressure.  Denies vaginal bleeding or leaking of fluid. +FM.  Did not take anything for the pain.  Pain score: 7/10  Past Medical History: Past Medical History:  Diagnosis Date   Epilepsy (HCC)    last seizure age 52 years   Left axillary hidradenitis    Trichomonas infection    UTI (urinary tract infection)     Past obstetric history: OB History  Gravida Para Term Preterm AB Living  3 2 2   2   SAB IAB Ectopic Multiple Live Births      2    # Outcome Date GA Lbr Len/2nd Weight Sex Type Anes PTL Lv  3 Current           2 Term 05/16/14     Vag-Spont   LIV  1 Term 06/04/07     Vag-Spont   LIV    Past Surgical History: Past Surgical History:  Procedure Laterality Date   ADJACENT TISSUE TRANSFER/TISSUE REARRANGEMENT Left 01/13/2023   Procedure: ADJACENT TISSUE TRANSFER LEFT CHEST TO LEFT AXILLA 100-150 CM;  Surgeon: Arelia Filippo, MD;  Location: [facility];  Service: Plastics;  Laterality: Left;   BARTHOLIN CYST MARSUPIALIZATION     HYDRADENITIS EXCISION Right 04/28/2022   Procedure: EXCISION HIDRADENITIS RIGHT AXILLA;  Surgeon: Signe Mitzie LABOR, MD;  Location: [facility];  Service: General;  Laterality: Right;   HYDRADENITIS EXCISION Left 01/13/2023   Procedure: EXCISION HIDRADENITIS LEFT AXILLA;  Surgeon: Signe Mitzie LABOR, MD;  Location: [facility];  Service: General;  Laterality: Left;    Family History: Family History  Problem Relation Age of Onset   Hypertension Mother    Hypertension Father    Sickle cell anemia Maternal Aunt    Cancer Paternal Uncle    Diabetes Maternal Grandmother    Breast cancer Maternal Grandmother    Diabetes Paternal Grandmother     Social History: Social History   Tobacco Use   Smoking status: Never   Smokeless tobacco: Never  Vaping Use   Vaping status: Never Used  Substance Use Topics   Alcohol use: No   Drug use: No    Allergies:  Allergies  Allergen Reactions   Penicillins Other (See Comments)    From when younger Has patient had a PCN reaction causing immediate rash, facial/tongue/throat swelling, SOB or lightheadedness with hypotension: No Has patient had a PCN reaction causing severe rash involving mucus membranes or skin necrosis: No Has patient had a PCN reaction that required hospitalization: no Has patient had a PCN reaction occurring within the last 10 years: no If  all of the above answers are NO, then may proceed with Cephalosporin use.    Wellbutrin [Bupropion] Other (See Comments)    Has seizures    Meds:  Medications Prior to Admission  Medication Sig Dispense Refill Last Dose/Taking   Prenatal Vit-Fe Fumarate-FA (MULTIVITAMIN-PRENATAL) 27-0.8 MG TABS tablet Take 1 tablet by mouth daily at 12 noon.   01/20/2024 at  7:40 AM   gabapentin  (NEURONTIN ) 300 MG capsule Take 1 capsule (300 mg total) by mouth 3 (three) times daily. 90 capsule 2    prochlorperazine  (COMPAZINE ) 10 MG tablet Take 1 tablet (10 mg total) by mouth 2 (two) times daily as needed for nausea or vomiting (and headache). 10 tablet 0     I have reviewed patient's Past Medical  Hx, Surgical Hx, Family Hx, Social Hx, medications and allergies.   ROS:  Review of Systems  Constitutional:  Negative for chills and fever.  Respiratory:  Negative for shortness of breath.   Gastrointestinal:  Negative for constipation, diarrhea and nausea.  Genitourinary:  Negative for vaginal bleeding.   Other systems negative  Physical Exam  Patient Vitals for the past 24 hrs:  BP Temp Temp src Pulse Resp SpO2 Height Weight  01/21/24 0613 111/66 98.4 F (36.9 C) Oral 88 17 100 % -- --  01/21/24 0608 -- -- -- -- -- -- 5' 3.5 (1.613 m) 116.8 kg   Constitutional: Well-developed, well-nourished female in no acute distress.  Cardiovascular: normal rate  Respiratory: normal effort GI: Abd soft, overall non-tender, gravid appropriate for gestational age.   No rebound or guarding.  Slightly tender over round ligaments MS: Extremities nontender, no edema, normal ROM Neurologic: Alert and oriented x 4.   PELVIC EXAM:   Dilation: Closed Effacement (%): Thick Station: Ballotable Exam by:: Earnie Pouch, CNM  FHT:  Baseline 145 , moderate variability, accelerations present, no decelerations Contractions: None   Labs: Results for orders placed or performed during the hospital encounter of 01/21/24 (from the past 24 hours)  Urinalysis, Routine w reflex microscopic -Urine, Clean Catch     Status: Abnormal   Collection Time: 01/21/24  6:15 AM  Result Value Ref Range   Color, Urine YELLOW YELLOW   APPearance HAZY (A) CLEAR   Specific Gravity, Urine 1.011 1.005 - 1.030   pH 7.0 5.0 - 8.0   Glucose, UA NEGATIVE NEGATIVE mg/dL   Hgb urine dipstick NEGATIVE NEGATIVE   Bilirubin Urine NEGATIVE NEGATIVE   Ketones, ur 20 (A) NEGATIVE mg/dL   Protein, ur NEGATIVE NEGATIVE mg/dL   Nitrite NEGATIVE NEGATIVE   Leukocytes,Ua MODERATE (A) NEGATIVE   RBC / HPF 0-5 0 - 5 RBC/hpf   WBC, UA 0-5 0 - 5 WBC/hpf   Bacteria, UA RARE (A) NONE SEEN   Squamous Epithelial / HPF 0-5 0 - 5 /HPF   Mucus  PRESENT        Imaging:  No results found.  MAU Course/MDM: I have reviewed the triage vital signs and the nursing notes.   Pertinent labs & imaging results that were available during my care of the patient were reviewed by me and considered in my medical decision making (see chart for details).      I have reviewed her medical records including past results, notes and treatments.   I have ordered labs and reviewed results.  NST reviewed  Assessment: Single IUP at [redacted]w[redacted]d Round ligament pain  Plan: Discharge home Preterm Labor precautions and fetal kick counts Follow up in Office for prenatal  visits and recheck  Pt stable at time of discharge.  Earnie Pouch CNM, MSN Certified Nurse-Midwife 01/21/2024 6:48 AM

## 2024-03-12 ENCOUNTER — Inpatient Hospital Stay (HOSPITAL_COMMUNITY)
Admission: AD | Admit: 2024-03-12 | Discharge: 2024-03-12 | Disposition: A | Discharge status: Home / Self Care | Attending: Obstetrics and Gynecology | Admitting: Obstetrics and Gynecology

## 2024-03-12 ENCOUNTER — Encounter (HOSPITAL_COMMUNITY): Payer: Self-pay | Admitting: Obstetrics and Gynecology

## 2024-03-12 DIAGNOSIS — O26893 Other specified pregnancy related conditions, third trimester: Secondary | ICD-10-CM

## 2024-03-12 DIAGNOSIS — N949 Unspecified condition associated with female genital organs and menstrual cycle: Secondary | ICD-10-CM

## 2024-03-12 DIAGNOSIS — Z3A35 35 weeks gestation of pregnancy: Secondary | ICD-10-CM

## 2024-03-12 DIAGNOSIS — R1031 Right lower quadrant pain: Secondary | ICD-10-CM | POA: Diagnosis present

## 2024-03-12 DIAGNOSIS — Z3689 Encounter for other specified antenatal screening: Secondary | ICD-10-CM

## 2024-03-12 NOTE — Discharge Instructions (Signed)

## 2024-03-12 NOTE — MAU Note (Signed)
 Pt says at 0400- woke hurting in right lower abd  10/10 - no meds. PNC- with [facility] - went there yesterday - for UC's- VE- 2 cm .  If UC's became closer - go to [facility] hospital. But this am - came here.  No pain on right lower side now.  Also feels UC's - gets SOB with pain of UC.  Without UC- No SOB

## 2024-03-12 NOTE — MAU Provider Note (Signed)
 S Ms. Shannon Velasquez is a 38 y.o. G23P2002 pregnant female at [redacted]w[redacted]d who presents to MAU today with complaint of sharp right lower abdominal pain about an hour ago upon waking plus contractions that made her feel breathless. No pain or contractions now, baby is moving well. No other physical complaints.   Receives care at [facility]. Prenatal records reviewed.  Pertinent items noted in HPI and remainder of comprehensive ROS otherwise negative.   O BP 108/69 (BP Location: Right Arm)   Pulse 100   Temp 98 F (36.7 C) (Oral)   Resp 12   Ht 5' 3 (1.6 m)   Wt 256 lb 8 oz (116.3 kg)   LMP 12/22/2022 (Exact Date)   SpO2 99%   BMI 45.44 kg/m  Physical Exam Vitals and nursing note reviewed.  Constitutional:      General: She is not in acute distress.    Appearance: Normal appearance. She is not ill-appearing.  Eyes:     Pupils: Pupils are equal, round, and reactive to light.  Cardiovascular:     Rate and Rhythm: Normal rate and regular rhythm.  Pulmonary:     Effort: Pulmonary effort is normal.  Abdominal:     Palpations: Abdomen is soft.     Tenderness: There is no abdominal tenderness.  Musculoskeletal:        General: Normal range of motion.  Skin:    General: Skin is warm and dry.     Capillary Refill: Capillary refill takes less than 2 seconds.  Neurological:     Mental Status: She is alert and oriented to person, place, and time.  Psychiatric:        Mood and Affect: Mood normal.        Behavior: Behavior normal.    No results found for this or any previous visit (from the past 24 hours).  MDM: Straightforward MAU Course: No pain on arrival to MAU. NST reactive. Advised pain could have been a muscle spasm or round ligament pain and to stretch/move around if this happens again.  A 1. Round ligament pain (Primary) - Discharge patient  2. NST (non-stress test) reactive  3. [redacted] weeks gestation of pregnancy   P Discharge from MAU in stable condition with  return precautions Follow up at [facility] as scheduled for ongoing prenatal care  No future appointments. Allergies as of 03/12/2024       Reactions   Penicillins Other (See Comments)   From when younger Has patient had a PCN reaction causing immediate rash, facial/tongue/throat swelling, SOB or lightheadedness with hypotension: No Has patient had a PCN reaction causing severe rash involving mucus membranes or skin necrosis: No Has patient had a PCN reaction that required hospitalization: no Has patient had a PCN reaction occurring within the last 10 years: no If all of the above answers are NO, then may proceed with Cephalosporin use.   Wellbutrin [bupropion] Other (See Comments)   Has seizures        Medication List     TAKE these medications    gabapentin  300 MG capsule Commonly known as: Neurontin  Take 1 capsule (300 mg total) by mouth 3 (three) times daily.   multivitamin-prenatal 27-0.8 MG Tabs tablet Take 1 tablet by mouth daily at 12 noon.   prochlorperazine  10 MG tablet Commonly known as: COMPAZINE  Take 1 tablet (10 mg total) by mouth 2 (two) times daily as needed for nausea or vomiting (and headache).       Vannie,  Cornell SAUNDERS, CNM 03/12/2024 5:59 AM

## 2024-03-15 LAB — OB RESULTS CONSOLE GBS: GBS: NEGATIVE

## 2024-03-15 NOTE — Progress Notes (Signed)
 Routine ob, GBS and u/s good fm having back pain ketones negative

## 2024-03-15 NOTE — Progress Notes (Signed)
 Briefly, Shannon Velasquez [phone] at [redacted]w[redacted]d Doing well. Was seen at [facility] L&D for abdominal pain--dx with round ligament pain.  Discussed.     FULL NOTE:  Assessment Problem List Items Addressed This Visit     Pregnancy (CMD)   Overview   Dating by 8wk u/s. Estimated Date of Delivery: 04/10/24 Normal NT and NIPS, female Early 1hGTT- 87 Normal 20 and 24-week ultrasound: Right lateral placenta  1hGTT- 129  Pregravid BMI 46 plan growth u/s.        Maternal morbid obesity, antepartum    (CMD)   Overview   Early 1 hour GTT-87 Monthly ultrasound to start at 28 weeks 28-week ultrasound- 48%ile with AC 44% 32-week ultrasound- 36-week ultrasound- 36wks--BPP 8/8, AFI 14.5cm Vertex      Encounter for other specified antenatal screening (CMD) - Primary   Overview   Diagnosis necessary for prenatal screening labs       HPI This is a 38 y.o.  G3P2002 at [redacted]w[redacted]d for return OB visit. Her Estimated Due Date is 04/10/2024, by Ultrasound  Denies leaking of fluid, vaginal bleeding, or contractions. Reports fetal movement.   Objective Vitals:   03/15/24 0932  BP: 116/74   General: well developed, well nourished, in no acute distress HEENT: normocephalic and atraumatic, mucous membranes moist, EOMI Resp: even and unlabored Psych: Normal mood and affect. All responses appropriate. Fundal Height- deferred d/t u/s Fetal Heart Tones- present 143  Return in about 1 week (around 03/22/2024) for OB visit, BPP.  All questions were answered. Labor precautions given.    Shannon Velasquez 03/15/2024 9:48 AM

## 2024-03-20 ENCOUNTER — Inpatient Hospital Stay (HOSPITAL_COMMUNITY): Admitting: Anesthesiology

## 2024-03-20 ENCOUNTER — Inpatient Hospital Stay (HOSPITAL_COMMUNITY)
Admission: AD | Admit: 2024-03-20 | Discharge: 2024-03-22 | DRG: 768 | Disposition: A | Discharge status: Home / Self Care | Payer: Self-pay | Attending: Obstetrics & Gynecology | Admitting: Obstetrics & Gynecology

## 2024-03-20 ENCOUNTER — Other Ambulatory Visit: Payer: Self-pay

## 2024-03-20 ENCOUNTER — Encounter (HOSPITAL_COMMUNITY): Payer: Self-pay | Admitting: Obstetrics & Gynecology

## 2024-03-20 ENCOUNTER — Encounter (HOSPITAL_COMMUNITY)
Admission: AD | Disposition: A | Discharge status: Home / Self Care | Payer: Self-pay | Source: Home / Self Care | Attending: Obstetrics & Gynecology

## 2024-03-20 DIAGNOSIS — Z880 Allergy status to penicillin: Secondary | ICD-10-CM

## 2024-03-20 DIAGNOSIS — D259 Leiomyoma of uterus, unspecified: Secondary | ICD-10-CM | POA: Diagnosis present

## 2024-03-20 DIAGNOSIS — Z3A37 37 weeks gestation of pregnancy: Secondary | ICD-10-CM

## 2024-03-20 DIAGNOSIS — O99210 Obesity complicating pregnancy, unspecified trimester: Secondary | ICD-10-CM | POA: Diagnosis present

## 2024-03-20 DIAGNOSIS — K219 Gastro-esophageal reflux disease without esophagitis: Secondary | ICD-10-CM | POA: Diagnosis present

## 2024-03-20 DIAGNOSIS — E66813 Obesity, class 3: Secondary | ICD-10-CM | POA: Diagnosis present

## 2024-03-20 DIAGNOSIS — Z833 Family history of diabetes mellitus: Secondary | ICD-10-CM | POA: Diagnosis not present

## 2024-03-20 DIAGNOSIS — O4292 Full-term premature rupture of membranes, unspecified as to length of time between rupture and onset of labor: Secondary | ICD-10-CM | POA: Diagnosis present

## 2024-03-20 DIAGNOSIS — Z8249 Family history of ischemic heart disease and other diseases of the circulatory system: Secondary | ICD-10-CM

## 2024-03-20 DIAGNOSIS — Z6841 Body Mass Index (BMI) 40.0 and over, adult: Secondary | ICD-10-CM | POA: Diagnosis not present

## 2024-03-20 DIAGNOSIS — O09523 Supervision of elderly multigravida, third trimester: Secondary | ICD-10-CM | POA: Diagnosis present

## 2024-03-20 DIAGNOSIS — O9902 Anemia complicating childbirth: Secondary | ICD-10-CM | POA: Diagnosis present

## 2024-03-20 DIAGNOSIS — O9962 Diseases of the digestive system complicating childbirth: Secondary | ICD-10-CM | POA: Diagnosis present

## 2024-03-20 DIAGNOSIS — Z3A00 Weeks of gestation of pregnancy not specified: Secondary | ICD-10-CM | POA: Diagnosis not present

## 2024-03-20 DIAGNOSIS — O3413 Maternal care for benign tumor of corpus uteri, third trimester: Secondary | ICD-10-CM | POA: Diagnosis present

## 2024-03-20 DIAGNOSIS — O26893 Other specified pregnancy related conditions, third trimester: Secondary | ICD-10-CM | POA: Diagnosis present

## 2024-03-20 DIAGNOSIS — O99214 Obesity complicating childbirth: Secondary | ICD-10-CM | POA: Diagnosis present

## 2024-03-20 DIAGNOSIS — O4202 Full-term premature rupture of membranes, onset of labor within 24 hours of rupture: Secondary | ICD-10-CM | POA: Diagnosis not present

## 2024-03-20 HISTORY — PX: DILATION AND CURETTAGE OF UTERUS: SHX78

## 2024-03-20 LAB — CBC
HCT: 34.6 % — ABNORMAL LOW (ref 36.0–46.0)
HCT: 28 % — ABNORMAL LOW (ref 36.0–46.0)
Hemoglobin: 11.1 g/dL — ABNORMAL LOW (ref 12.0–15.0)
Hemoglobin: 9.1 g/dL — ABNORMAL LOW (ref 12.0–15.0)
MCH: 25.6 pg — ABNORMAL LOW (ref 26.0–34.0)
MCH: 25.9 pg — ABNORMAL LOW (ref 26.0–34.0)
MCHC: 32.1 g/dL (ref 30.0–36.0)
MCHC: 32.5 g/dL (ref 30.0–36.0)
MCV: 79.7 fL — ABNORMAL LOW (ref 80.0–100.0)
MCV: 79.8 fL — ABNORMAL LOW (ref 80.0–100.0)
Platelets: 233 K/uL (ref 150–400)
Platelets: 198 K/uL (ref 150–400)
RBC: 4.34 MIL/uL (ref 3.87–5.11)
RBC: 3.51 MIL/uL — ABNORMAL LOW (ref 3.87–5.11)
RDW: 16.3 % — ABNORMAL HIGH (ref 11.5–15.5)
RDW: 16.1 % — ABNORMAL HIGH (ref 11.5–15.5)
WBC: 5.7 K/uL (ref 4.0–10.5)
WBC: 11.1 K/uL — ABNORMAL HIGH (ref 4.0–10.5)
nRBC: 0 % (ref 0.0–0.2)
nRBC: 0 % (ref 0.0–0.2)

## 2024-03-20 LAB — COMPREHENSIVE METABOLIC PANEL WITH GFR
ALT: 9 U/L (ref 0–44)
AST: 15 U/L (ref 15–41)
Albumin: 2.6 g/dL — ABNORMAL LOW (ref 3.5–5.0)
Alkaline Phosphatase: 105 U/L (ref 38–126)
Anion gap: 8 (ref 5–15)
BUN: 8 mg/dL (ref 6–20)
CO2: 20 mmol/L — ABNORMAL LOW (ref 22–32)
Calcium: 8.3 mg/dL — ABNORMAL LOW (ref 8.9–10.3)
Chloride: 105 mmol/L (ref 98–111)
Creatinine, Ser: 0.69 mg/dL (ref 0.44–1.00)
GFR, Estimated: >60 mL/min (ref >60)
Glucose, Bld: 83 mg/dL (ref 70–99)
Potassium: 3.7 mmol/L (ref 3.5–5.1)
Sodium: 133 mmol/L — ABNORMAL LOW (ref 135–145)
Total Bilirubin: 0.4 mg/dL (ref 0.0–1.2)
Total Protein: 6.2 g/dL — ABNORMAL LOW (ref 6.5–8.1)

## 2024-03-20 LAB — RPR: RPR Ser Ql: NONREACTIVE

## 2024-03-20 LAB — POCT I-STAT EG7
Acid-base deficit: 5 mmol/L — ABNORMAL HIGH (ref 0.0–2.0)
Bicarbonate: 20.4 mmol/L (ref 20.0–28.0)
Calcium, Ion: 1.17 mmol/L (ref 1.15–1.40)
HCT: 29 % — ABNORMAL LOW (ref 36.0–46.0)
Hemoglobin: 9.9 g/dL — ABNORMAL LOW (ref 12.0–15.0)
O2 Saturation: 77 %
Patient temperature: 36
Potassium: 3.4 mmol/L — ABNORMAL LOW (ref 3.5–5.1)
Sodium: 136 mmol/L (ref 135–145)
TCO2: 22 mmol/L (ref 22–32)
pCO2, Ven: 39 mmHg — ABNORMAL LOW (ref 44–60)
pH, Ven: 7.323 (ref 7.25–7.43)
pO2, Ven: 42 mmHg (ref 32–45)

## 2024-03-20 LAB — TYPE AND SCREEN
ABO/RH(D): B POS
Antibody Screen: NEGATIVE

## 2024-03-20 LAB — POCT FERN TEST: POCT Fern Test: POSITIVE

## 2024-03-20 LAB — ABO/RH: ABO/RH(D): B POS

## 2024-03-20 SURGERY — DILATION AND CURETTAGE
Anesthesia: General | Site: Vagina

## 2024-03-20 MED ORDER — ONDANSETRON HCL 4 MG PO TABS: 4.0000 mg | ORAL_TABLET | ORAL | Status: DC | PRN

## 2024-03-20 MED ORDER — LACTATED RINGERS IV SOLN: INTRAVENOUS | Status: DC | PRN

## 2024-03-20 MED ORDER — SODIUM CHLORIDE 0.9 % IV SOLN
3.0000 g | Freq: Four times a day (QID) | INTRAVENOUS | Status: AC
  Administered 2024-03-20 – 2024-03-21 (×4): 3 g via INTRAVENOUS
  Filled 2024-03-20 (×4): qty 8

## 2024-03-20 MED ORDER — TETANUS-DIPHTH-ACELL PERTUSSIS 5-2.5-18.5 LF-MCG/0.5 IM SUSY: 0.5000 mL | PREFILLED_SYRINGE | Freq: Once | INTRAMUSCULAR | Status: DC

## 2024-03-20 MED ORDER — SENNOSIDES-DOCUSATE SODIUM 8.6-50 MG PO TABS
2.0000 | ORAL_TABLET | ORAL | Status: DC
  Administered 2024-03-20 – 2024-03-22 (×3): 2 via ORAL
  Filled 2024-03-20 (×3): qty 2

## 2024-03-20 MED ORDER — SOD CITRATE-CITRIC ACID 500-334 MG/5ML PO SOLN: 30.0000 mL | ORAL | Status: DC | PRN

## 2024-03-20 MED ORDER — EPHEDRINE 5 MG/ML INJ
INTRAVENOUS | Status: AC
Start: 2024-03-20 — End: 2024-03-20
  Filled 2024-03-20: qty 5

## 2024-03-20 MED ORDER — NITROGLYCERIN IN D5W 200-5 MCG/ML-% IV SOLN
INTRAVENOUS | Status: AC
  Filled 2024-03-20: qty 250

## 2024-03-20 MED ORDER — DIPHENHYDRAMINE HCL 25 MG PO CAPS: 25.0000 mg | ORAL_CAPSULE | Freq: Four times a day (QID) | ORAL | Status: DC | PRN

## 2024-03-20 MED ORDER — STERILE WATER FOR IRRIGATION IR SOLN
Status: DC | PRN
  Administered 2024-03-20: 1000 mL

## 2024-03-20 MED ORDER — COCONUT OIL OIL: 1.0000 | TOPICAL_OIL | Status: DC | PRN

## 2024-03-20 MED ORDER — WITCH HAZEL-GLYCERIN EX PADS: 1.0000 | MEDICATED_PAD | CUTANEOUS | Status: DC | PRN

## 2024-03-20 MED ORDER — MEASLES, MUMPS & RUBELLA VAC IJ SOLR: 0.5000 mL | Freq: Once | INTRAMUSCULAR | Status: DC

## 2024-03-20 MED ORDER — PHENYLEPHRINE HCL (PRESSORS) 10 MG/ML IV SOLN
INTRAVENOUS | Status: DC | PRN
  Administered 2024-03-20 (×2): 160 mg via INTRAVENOUS

## 2024-03-20 MED ORDER — METHYLERGONOVINE MALEATE 0.2 MG/ML IJ SOLN
INTRAMUSCULAR | Status: DC | PRN
  Administered 2024-03-20: .2 mg via INTRAMUSCULAR

## 2024-03-20 MED ORDER — EPHEDRINE 5 MG/ML INJ
10.0000 mg | INTRAVENOUS | Status: DC | PRN
  Filled 2024-03-20: qty 5

## 2024-03-20 MED ORDER — CEFAZOLIN SODIUM-DEXTROSE 2-3 GM-%(50ML) IV SOLR
INTRAVENOUS | Status: DC | PRN
  Administered 2024-03-20: 2 g via INTRAVENOUS

## 2024-03-20 MED ORDER — FENTANYL CITRATE (PF) 100 MCG/2ML IJ SOLN
INTRAMUSCULAR | Status: DC | PRN
  Administered 2024-03-20: 50 ug via INTRAVENOUS

## 2024-03-20 MED ORDER — ONDANSETRON HCL 4 MG/2ML IJ SOLN
INTRAMUSCULAR | Status: DC | PRN
  Administered 2024-03-20: 4 mg via INTRAVENOUS

## 2024-03-20 MED ORDER — LACTATED RINGERS IV SOLN: 500.0000 mL | INTRAVENOUS | Status: DC | PRN

## 2024-03-20 MED ORDER — CEFAZOLIN SODIUM-DEXTROSE 2-4 GM/100ML-% IV SOLN: 2.0000 g | Freq: Once | INTRAVENOUS | Status: DC

## 2024-03-20 MED ORDER — VASOPRESSIN 20 UNIT/ML IV SOLN
INTRAVENOUS | Status: DC | PRN
Start: 2024-03-20 — End: 2024-03-20
  Administered 2024-03-20 (×3): 1 [IU] via INTRAVENOUS

## 2024-03-20 MED ORDER — LIDOCAINE HCL (PF) 1 % IJ SOLN: 30.0000 mL | INTRAMUSCULAR | Status: DC | PRN

## 2024-03-20 MED ORDER — OXYTOCIN BOLUS FROM INFUSION
333.0000 mL | Freq: Once | INTRAVENOUS | Status: AC
  Administered 2024-03-20: 333 mL via INTRAVENOUS

## 2024-03-20 MED ORDER — OXYTOCIN-SODIUM CHLORIDE 30-0.9 UT/500ML-% IV SOLN: 2.5000 [IU]/h | INTRAVENOUS | Status: DC

## 2024-03-20 MED ORDER — FENTANYL CITRATE (PF) 100 MCG/2ML IJ SOLN
50.0000 ug | INTRAMUSCULAR | Status: DC | PRN
  Administered 2024-03-20 (×2): 100 ug via INTRAVENOUS
  Filled 2024-03-20 (×2): qty 2

## 2024-03-20 MED ORDER — SUCCINYLCHOLINE CHLORIDE 200 MG/10ML IV SOSY
PREFILLED_SYRINGE | INTRAVENOUS | Status: AC
  Filled 2024-03-20: qty 10

## 2024-03-20 MED ORDER — DIBUCAINE (PERIANAL) 1 % EX OINT
1.0000 | TOPICAL_OINTMENT | CUTANEOUS | Status: DC | PRN
Start: 2024-03-20 — End: 2024-03-22

## 2024-03-20 MED ORDER — LIDOCAINE HCL (PF) 1 % IJ SOLN: INTRAMUSCULAR | Status: DC | PRN

## 2024-03-20 MED ORDER — ARTIFICIAL TEARS OPHTHALMIC OINT
TOPICAL_OINTMENT | OPHTHALMIC | Status: AC
  Filled 2024-03-20: qty 3.5

## 2024-03-20 MED ORDER — ONDANSETRON HCL 4 MG/2ML IJ SOLN: 4.0000 mg | INTRAMUSCULAR | Status: DC | PRN

## 2024-03-20 MED ORDER — LACTATED RINGERS IV SOLN
500.0000 mL | Freq: Once | INTRAVENOUS | Status: AC
  Administered 2024-03-20: 500 mL via INTRAVENOUS

## 2024-03-20 MED ORDER — LACTATED RINGERS IV SOLN: INTRAVENOUS | Status: DC

## 2024-03-20 MED ORDER — BENZOCAINE-MENTHOL 20-0.5 % EX AERO: 1.0000 | INHALATION_SPRAY | CUTANEOUS | Status: DC | PRN

## 2024-03-20 MED ORDER — ACETAMINOPHEN 325 MG PO TABS
650.0000 mg | ORAL_TABLET | ORAL | Status: DC | PRN
  Administered 2024-03-20: 650 mg via ORAL
  Filled 2024-03-20: qty 2

## 2024-03-20 MED ORDER — PROPOFOL 10 MG/ML IV BOLUS
INTRAVENOUS | Status: AC
  Filled 2024-03-20: qty 20

## 2024-03-20 MED ORDER — ONDANSETRON HCL 4 MG/2ML IJ SOLN
INTRAMUSCULAR | Status: AC
  Filled 2024-03-20: qty 2

## 2024-03-20 MED ORDER — OXYCODONE-ACETAMINOPHEN 5-325 MG PO TABS: 1.0000 | ORAL_TABLET | ORAL | Status: DC | PRN

## 2024-03-20 MED ORDER — CLINDAMYCIN PHOSPHATE 900 MG/50ML IV SOLN
INTRAVENOUS | Status: AC
  Filled 2024-03-20: qty 50

## 2024-03-20 MED ORDER — OXYCODONE-ACETAMINOPHEN 5-325 MG PO TABS: 2.0000 | ORAL_TABLET | ORAL | Status: DC | PRN

## 2024-03-20 MED ORDER — PHENYLEPHRINE 80 MCG/ML (10ML) SYRINGE FOR IV PUSH (FOR BLOOD PRESSURE SUPPORT): 80.0000 ug | PREFILLED_SYRINGE | INTRAVENOUS | Status: DC | PRN

## 2024-03-20 MED ORDER — LIDOCAINE 2% (20 MG/ML) 5 ML SYRINGE
INTRAMUSCULAR | Status: AC
  Filled 2024-03-20: qty 5

## 2024-03-20 MED ORDER — LIDOCAINE HCL (PF) 1 % IJ SOLN
INTRAMUSCULAR | Status: DC | PRN
  Administered 2024-03-20 (×2): 5 mL via EPIDURAL

## 2024-03-20 MED ORDER — ONDANSETRON HCL 4 MG/2ML IJ SOLN
4.0000 mg | Freq: Four times a day (QID) | INTRAMUSCULAR | Status: DC | PRN
Start: 2024-03-20 — End: 2024-03-20

## 2024-03-20 MED ORDER — DIPHENHYDRAMINE HCL 50 MG/ML IJ SOLN: 12.5000 mg | INTRAMUSCULAR | Status: DC | PRN

## 2024-03-20 MED ORDER — PROPOFOL 10 MG/ML IV BOLUS
INTRAVENOUS | Status: DC | PRN
  Administered 2024-03-20: 200 mg via INTRAVENOUS

## 2024-03-20 MED ORDER — SODIUM CHLORIDE 0.9% FLUSH: 3.0000 mL | INTRAVENOUS | Status: DC | PRN

## 2024-03-20 MED ORDER — SIMETHICONE 80 MG PO CHEW: 80.0000 mg | CHEWABLE_TABLET | ORAL | Status: DC | PRN

## 2024-03-20 MED ORDER — TERBUTALINE SULFATE 1 MG/ML IJ SOLN: 0.2500 mg | Freq: Once | INTRAMUSCULAR | Status: DC | PRN

## 2024-03-20 MED ORDER — PHENYLEPHRINE 80 MCG/ML (10ML) SYRINGE FOR IV PUSH (FOR BLOOD PRESSURE SUPPORT)
PREFILLED_SYRINGE | INTRAVENOUS | Status: AC
  Filled 2024-03-20: qty 10

## 2024-03-20 MED ORDER — TRANEXAMIC ACID-NACL 1000-0.7 MG/100ML-% IV SOLN
INTRAVENOUS | Status: DC | PRN
  Administered 2024-03-20: 1000 mg via INTRAVENOUS

## 2024-03-20 MED ORDER — PHENYLEPHRINE 80 MCG/ML (10ML) SYRINGE FOR IV PUSH (FOR BLOOD PRESSURE SUPPORT)
80.0000 ug | PREFILLED_SYRINGE | INTRAVENOUS | Status: DC | PRN
  Filled 2024-03-20: qty 10

## 2024-03-20 MED ORDER — IBUPROFEN 800 MG PO TABS
800.0000 mg | ORAL_TABLET | Freq: Three times a day (TID) | ORAL | Status: DC
  Administered 2024-03-20 – 2024-03-22 (×6): 800 mg via ORAL
  Filled 2024-03-20 (×6): qty 1

## 2024-03-20 MED ORDER — OXYTOCIN-SODIUM CHLORIDE 30-0.9 UT/500ML-% IV SOLN
INTRAVENOUS | Status: AC
  Filled 2024-03-20: qty 500

## 2024-03-20 MED ORDER — PRENATAL MULTIVITAMIN CH
1.0000 | ORAL_TABLET | Freq: Every day | ORAL | Status: DC
  Administered 2024-03-21 – 2024-03-22 (×2): 1 via ORAL
  Filled 2024-03-20 (×2): qty 1

## 2024-03-20 MED ORDER — SODIUM CHLORIDE 0.9% FLUSH
3.0000 mL | Freq: Two times a day (BID) | INTRAVENOUS | Status: DC
  Administered 2024-03-20 (×2): 3 mL via INTRAVENOUS

## 2024-03-20 MED ORDER — ALBUMIN HUMAN 5 % IV SOLN: INTRAVENOUS | Status: DC | PRN

## 2024-03-20 MED ORDER — LIDOCAINE HCL (CARDIAC) PF 100 MG/5ML IV SOSY
PREFILLED_SYRINGE | INTRAVENOUS | Status: DC | PRN
  Administered 2024-03-20: 40 mg via INTRAVENOUS

## 2024-03-20 MED ORDER — SUCCINYLCHOLINE CHLORIDE 200 MG/10ML IV SOSY
PREFILLED_SYRINGE | INTRAVENOUS | Status: DC | PRN
  Administered 2024-03-20: 120 mg via INTRAVENOUS

## 2024-03-20 MED ORDER — FENTANYL CITRATE (PF) 100 MCG/2ML IJ SOLN
INTRAMUSCULAR | Status: AC
  Filled 2024-03-20: qty 2

## 2024-03-20 MED ORDER — SODIUM CHLORIDE 0.9 % IV SOLN: 250.0000 mL | INTRAVENOUS | Status: DC | PRN

## 2024-03-20 MED ORDER — FENTANYL-BUPIVACAINE-NACL 0.5-0.125-0.9 MG/250ML-% EP SOLN
12.0000 mL/h | EPIDURAL | Status: DC | PRN
  Administered 2024-03-20: 12 mL/h via EPIDURAL
  Filled 2024-03-20: qty 250

## 2024-03-20 MED ORDER — EPHEDRINE 5 MG/ML INJ: 10.0000 mg | INTRAVENOUS | Status: DC | PRN

## 2024-03-20 MED ORDER — ACETAMINOPHEN 325 MG PO TABS: 650.0000 mg | ORAL_TABLET | ORAL | Status: DC | PRN

## 2024-03-20 MED ORDER — OXYTOCIN-SODIUM CHLORIDE 30-0.9 UT/500ML-% IV SOLN
1.0000 m[IU]/min | INTRAVENOUS | Status: DC
  Administered 2024-03-20: 2 m[IU]/min via INTRAVENOUS
  Filled 2024-03-20: qty 500

## 2024-03-20 SURGICAL SUPPLY — 4 items
GLOVE ECLIPSE 8.0 STRL XLNG CF (GLOVE) IMPLANT
MAT PREVALON FULL STRYKER (MISCELLANEOUS) IMPLANT
PACK VAGINAL MINOR WOMEN LF (CUSTOM PROCEDURE TRAY) IMPLANT
SUT MNCRL+ AB 3-0 CT1 36 (SUTURE) IMPLANT

## 2024-03-20 NOTE — Progress Notes (Addendum)
    Faculty Practice OB/GYN Attending Note  Subjective:  Called to evaluate patient with retained placenta after recent vaginal delivery On encounter with her, she is talking  and having some ongoing bleeding. No lightheadedness.  Feels like her epidural is not working.    Objective:  Blood pressure (!) 97/52, pulse 90, temperature 97.9 F (36.6 C), temperature source Axillary, resp. rate 17, height 5' 3 (1.6 m), weight 115.2 kg, last menstrual period 12/22/2022, SpO2 96%. Gen: NAD HENT: Normocephalic, atraumatic Lungs: Normal respiratory effort Heart: Regular rate noted Abdomen: fundus firm above umbilicus Ext: 2+ DTRs, no edema, no cyanosis, negative Homan's sign Bimanual: Lower uterine segment noted to constricted, unable to accommodate more than two fingers.  Can palpate placental tissue high up in uterus, unable to manually evacuate as my hand could not fit.  Consulted Anesthesiology team.  Dr. Jerrye re-dosed her epidural, and gave SL Nitroglycerin .  Re-attempted manual extraction of placenta, this was very uncomfortable for patient, and I was only able to get a portion of it as the lower uterine segment was not open enough to do this extraction.  Patient asked me to stop, she wants to have more anesthesia on board.  EBL so far ~700 ml  Assessment & Plan:  38 y.o. [id number] PPD#1 s/p SVD complicated by retained placenta. Patient was consented for postpartum curettage and removal of retained placenta.   The risks of surgery were discussed including but not limited to: bleeding which may require transfusion or reoperation; infection which may require antibiotic therapy; injury to surrounding organs which may lead to other procedures; formation of intrauterine adhesions; need for additional procedures including laparotomy or hysterectomy in the event of a life-threatening hemorrhage not ameliorated by other measures; or subsequent procedures secondary to intraoperative injury or abnormal  pathology; and other postoperative or anesthesia complications.  Patient was told that the likelihood that her condition and symptoms will be treated effectively with this surgical management was very high; the postoperative expectations were also discussed in detail. The patient concurred with the proposed plan, giving informed written consent for the procedure.   OB OR RN in charge, Anesthesia aware. Preoperative prophylactic antibiotics and SCDs ordered on call to the OR.  To OR when ready.   GLORIS HUGGER, MD, FACOG Obstetrician & Gynecologist, [facility] for [employer], [facility]

## 2024-03-20 NOTE — Transfer of Care (Signed)
 Immediate Anesthesia Transfer of Care Note  Patient: Shannon Velasquez  Procedure(s) Performed: DILATION AND CURETTAGE (Vagina )  Patient Location: PACU  Anesthesia Type:General  Level of Consciousness: awake, alert , and oriented  Airway & Oxygen Therapy: Patient Spontanous Breathing  Post-op Assessment: Report given to RN and Post -op Vital signs reviewed and stable  Post vital signs: Reviewed and stable  Last Vitals:  Vitals Value Taken Time  BP 92/61 03/20/24 09:48  Temp    Pulse 75 03/20/24 09:59  Resp 13 03/20/24 09:59  SpO2 99 % 03/20/24 09:59  Vitals shown include unfiled device data.  Last Pain:  Vitals:   03/20/24 0945  TempSrc:   PainSc: 0-No pain      Patients Stated Pain Goal: 0 (03/20/24 0430)  Complications: No notable events documented.

## 2024-03-20 NOTE — H&P (Signed)
 OBSTETRIC ADMISSION HISTORY AND PHYSICAL  Shannon Velasquez is a 38 y.o. female [phone] with IUP at [redacted]w[redacted]d (dated by 8 week ultrasound, Estimated Date of Delivery: 04/10/24) presenting for SROM.   She reports +FMs, No LOF, no VB, no blurry vision, headaches or peripheral edema, and RUQ pain.    She plans on breast feeding. She is undecided on birth control  She received her prenatal care at [facility] in [place], [place]   Prenatal History/Complications:   -Obesity -AMA -Uterine fibroids  Past Medical History: Past Medical History:  Diagnosis Date   Epilepsy (HCC)    last seizure age 72 years   Left axillary hidradenitis    Trichomonas infection    UTI (urinary tract infection)     Past Surgical History: Past Surgical History:  Procedure Laterality Date   ADJACENT TISSUE TRANSFER/TISSUE REARRANGEMENT Left 01/13/2023   Procedure: ADJACENT TISSUE TRANSFER LEFT CHEST TO LEFT AXILLA 100-150 CM;  Surgeon: Arelia Filippo, MD;  Location: [facility];  Service: Plastics;  Laterality: Left;   BARTHOLIN CYST MARSUPIALIZATION     HYDRADENITIS EXCISION Right 04/28/2022   Procedure: EXCISION HIDRADENITIS RIGHT AXILLA;  Surgeon: Signe Mitzie LABOR, MD;  Location: [facility];  Service: General;  Laterality: Right;   HYDRADENITIS EXCISION Left 01/13/2023   Procedure: EXCISION HIDRADENITIS LEFT AXILLA;  Surgeon: Signe Mitzie LABOR, MD;  Location: [facility];  Service: General;  Laterality: Left;    Obstetrical History: OB History     Gravida  3   Para  2   Term  2   Preterm      AB      Living  2      SAB      IAB      Ectopic      Multiple      Live Births  2           Social History Social History   Socioeconomic History   Marital status: Single    Spouse name: Not on file   Number of children: Not on file   Years of education: Not on file   Highest education level: Not on file  Occupational History   Not on file  Tobacco Use    Smoking status: Never   Smokeless tobacco: Never  Vaping Use   Vaping status: Never Used  Substance and Sexual Activity   Alcohol use: Not Currently   Drug use: No   Sexual activity: Yes    Birth control/protection: None  Other Topics Concern   Not on file  Social History Narrative   Not on file   Social Drivers of Health   Financial Resource Strain: Not on file  Food Insecurity: No Food Insecurity (03/20/2024)   Hunger Vital Sign    Worried About Running Out of Food in the Last Year: Never true    Ran Out of Food in the Last Year: Never true  Transportation Needs: No Transportation Needs (03/20/2024)   PRAPARE - Administrator, Civil Service (Medical): No    Lack of Transportation (Non-Medical): No  Physical Activity: Not on file  Stress: Not on file  Social Connections: Not on file    Family History: Family History  Problem Relation Age of Onset   Hypertension Mother    Hypertension Father    Sickle cell anemia Maternal Aunt    Cancer Paternal Uncle    Diabetes Maternal Grandmother    Breast cancer Maternal Grandmother  Diabetes Paternal Grandmother     Allergies: Allergies  Allergen Reactions   Penicillins Other (See Comments)    From when younger Has patient had a PCN reaction causing immediate rash, facial/tongue/throat swelling, SOB or lightheadedness with hypotension: No Has patient had a PCN reaction causing severe rash involving mucus membranes or skin necrosis: No Has patient had a PCN reaction that required hospitalization: no Has patient had a PCN reaction occurring within the last 10 years: no If all of the above answers are NO, then may proceed with Cephalosporin use.    Wellbutrin [Bupropion] Other (See Comments)    Has seizures    Medications Prior to Admission  Medication Sig Dispense Refill Last Dose/Taking   Prenatal Vit-Fe Fumarate-FA (MULTIVITAMIN-PRENATAL) 27-0.8 MG TABS tablet Take 1 tablet by mouth daily at 12 noon.    03/19/2024   gabapentin  (NEURONTIN ) 300 MG capsule Take 1 capsule (300 mg total) by mouth 3 (three) times daily. (Patient not taking: Reported on 03/20/2024) 90 capsule 2 Not Taking   prochlorperazine  (COMPAZINE ) 10 MG tablet Take 1 tablet (10 mg total) by mouth 2 (two) times daily as needed for nausea or vomiting (and headache). (Patient not taking: Reported on 03/20/2024) 10 tablet 0 Not Taking     Review of Systems  All systems reviewed and negative except as stated in HPI.  Blood pressure 120/68, pulse 89, temperature 98 F (36.7 C), temperature source Oral, resp. rate 17, height 5' 3 (1.6 m), weight 115.2 kg, last menstrual period 12/22/2022, SpO2 99%. General appearance: alert, cooperative, and appears stated age Lungs: breathing comfortably on room air Heart: regular rate Abdomen: soft, non-tender; gravid Extremities: no edema of bilateral lower extremities DTR's intact Presentation: cephalic Fetal monitoringBaseline: 150 bpm, Variability: Good {> 6 bpm), Accelerations: Reactive, and Decelerations: Absent Uterine activityFrequency: Every 10 - 15 minutes  Dilation: 3 Effacement (%): 50 Station: -2 Exam by:: Hunter Stanley, RN   Prenatal labs: ABO, Rh:   Antibody:   Rubella:   RPR:    HBsAg:    HIV:    GBS: Negative/-- (08/26 0000)  2 hr Glucola wnl Genetic screening  NIPS: LR female Anatomy US  wnl, right lateral placenta Last US : At [redacted]w[redacted]d - cephalic presentation, EFW  (48 %tile), AC 44%  Prenatal Transfer Tool  Maternal Diabetes: No Genetic Screening: Normal Maternal Ultrasounds/Referrals: Normal Fetal Ultrasounds or other Referrals:  None Maternal Substance Abuse:  No Significant Maternal Medications:  None Significant Maternal Lab Results:  Group B Strep negative Number of Prenatal Visits:greater than 3 verified prenatal visits Other Comments:  None  Results for orders placed or performed during the hospital encounter of 03/20/24 (from the past 24 hours)   Fern Test   Collection Time: 03/20/24  3:40 AM  Result Value Ref Range   POCT Fern Test Positive = ruptured amniotic membanes   CBC   Collection Time: 03/20/24  3:45 AM  Result Value Ref Range   WBC 5.7 4.0 - 10.5 K/uL   RBC 4.34 3.87 - 5.11 MIL/uL   Hemoglobin 11.1 (L) 12.0 - 15.0 g/dL   HCT 65.3 (L) 63.9 - 53.9 %   MCV 79.7 (L) 80.0 - 100.0 fL   MCH 25.6 (L) 26.0 - 34.0 pg   MCHC 32.1 30.0 - 36.0 g/dL   RDW 83.6 (H) 88.4 - 84.4 %   Platelets 233 150 - 400 K/uL   nRBC 0.0 0.0 - 0.2 %    Patient Active Problem List   Diagnosis Date Noted  Epilepsy (HCC) 10/23/2022   Hidradenitis 07/04/2022   Migraine 02/11/2013    Assessment/Plan:  Shannon Velasquez is a 38 y.o. G3P2002 at [redacted]w[redacted]d here due to SROM at ~0130 on 03/20/2024.   #Labor:Discussed augmentation of labor with pitocin  if contractions space out. Patient is amenable.  #Pain: Per patient request, undecided on unmedicated vs epidural #FWB: Cat I #ID:  GBS (-) #MOF: Breast #MOC:undecided #Circ:  N/A  Barkley Angles, MD OB Fellow, Faculty Practice [facility] Center for [facility] 03/20/2024 4:11 AM

## 2024-03-20 NOTE — MAU Note (Signed)
 Shannon Velasquez is a 38 y.o. at [redacted]w[redacted]d here in MAU reporting: spontaneous rupture of membranes at 0130 with copious amount of clear fluid. Patient grossly ruptured in triage with fluid running down her legs.  Onset of complaint: 0130 on 03/20/2024 Pain score: 0/10 Vitals:   03/20/24 0314 03/20/24 0315  BP:  120/68  Pulse:  89  Resp:  17  Temp:  98 F (36.7 C)  SpO2: 99%      FHT: 160bpm Lab orders placed from triage: Rule Out Rupture

## 2024-03-20 NOTE — Anesthesia Procedure Notes (Signed)
 Procedure Name: Intubation Date/Time: 03/20/2024 8:55 AM  Performed by: Mylo Asberry BIRCH, CRNAPre-anesthesia Checklist: Patient identified, Emergency Drugs available, Suction available and Patient being monitored Patient Re-evaluated:Patient Re-evaluated prior to induction Oxygen Delivery Method: Circle system utilized Preoxygenation: Pre-oxygenation with 100% oxygen Induction Type: IV induction, Rapid sequence and Cricoid Pressure applied Laryngoscope Size: Glidescope and 3 Grade View: Grade I Tube type: Oral Tube size: 7.0 mm Number of attempts: 1 Airway Equipment and Method: Stylet, Oral airway and Video-laryngoscopy Placement Confirmation: ETT inserted through vocal cords under direct vision, positive ETCO2 and breath sounds checked- equal and bilateral Secured at: 22 cm Tube secured with: Tape Dental Injury: Teeth and Oropharynx as per pre-operative assessment

## 2024-03-20 NOTE — Lactation Note (Signed)
 This note was copied from a baby's chart. Lactation Consultation Note  Patient Name: Shannon Velasquez Date: 03/20/2024 Age:37 hours Reason for consult: Follow-up assessment  P3- LC brought [name]'s STORK pump into her room.  Maternal Data Has patient been taught Hand Expression?: No Does the patient have breastfeeding experience prior to this delivery?: Yes How long did the patient breastfeed?: 1.5 years with first child and 2 years with second child  Feeding Mother's Current Feeding Choice: Breast Milk  Lactation Tools Discussed/Used Pump Education: Milk Storage  Interventions Interventions: Breast feeding basics reviewed;Education;LC Services brochure  Discharge Discharge Education: Engorgement and breast care;Warning signs for feeding baby Pump: Received Stork Pump  Consult Status Consult Status: Follow-up Date: 03/21/24 Follow-up type: In-patient    Recardo Hoit BS, IBCLC 03/20/2024, 3:33 PM

## 2024-03-20 NOTE — Op Note (Addendum)
 Shannon Velasquez PROCEDURE DATE:  03/20/2024  PREOPERATIVE DIAGNOSIS: Retained placenta, failed attempt of manual extraction at bedside, first degree perineal laceration POSTOPERATIVE DIAGNOSIS: The same PROCEDURE:  Manual extraction of placenta, repair of first degree perineal laceration SURGEON:  Dr. Herlene Inch ASSISTANT: Dr. Alain Sor   INDICATIONS: 38 y.o.  [id number] with retained placenta and hemorrhage after SVD at [redacted]w[redacted]d, needing emergent surgical completion.  Risks of surgery were discussed with the patient including but not limited to: bleeding which may require transfusion; infection which may require antibiotics; injury to uterus or surrounding organs; need for additional procedures including laparotomy or laparoscopy; possibility of intrauterine scarring which may impair future fertility; risk of retained products which may require further management  and other postoperative/anesthesia complications. Written informed consent was obtained.  FINDINGS:  Fundal placenta with significant clots and blood.  Total EBL including delivery about 1500 total (approx 750cc prior to case, 700cc intraoperatively).    ANESTHESIA:    General endotracheal anesthesia INTRAVENOUS FLUIDS:  3500 ml ESTIMATED BLOOD LOSS:  700 ml  SPECIMENS:  Placental fragments sent to pathology COMPLICATIONS:  None immediate.  PROCEDURE DETAILS:  The patient was then taken to the operating room where general anesthesia was administered and was found to be adequate. A foley catheter was placed into the bladder.  After an adequate timeout was performed, she was placed in the dorsal lithotomy position and examined; then prepped and draped in the sterile manner.  The cervix was fully dilated.  A manual extraction of the placenta was performed by creating a plane anteriorly and fundally. The placenta was then removed intact and sent to pathology.  A fundal sweep was performed to ensure all placental fragments were removed.  The  patient was given IM Methergine  and IV Tranexamic Acid  given degree of bleeding.  A JADA device was then placed per manufacturer instructions.  A small first degree perineal laceration was repaired with 3-0 Monocryl in the usual fashion.  Sponge and instrument counts were correct times two.  The patient tolerated the procedure well and was taken to the recovery area awake and in stable condition.   Alain Sor, MD OB Fellow, Faculty Practice [facility], Center for [facility]

## 2024-03-20 NOTE — Anesthesia Postprocedure Evaluation (Signed)
 Anesthesia Post Note  Patient: Shannon Velasquez  Procedure(s) Performed: DILATION AND CURETTAGE (Vagina )     Patient location during evaluation: PACU Anesthesia Type: General Level of consciousness: awake and alert and oriented Pain management: pain level controlled Vital Signs Assessment: post-procedure vital signs reviewed and stable Respiratory status: spontaneous breathing, nonlabored ventilation and respiratory function stable Cardiovascular status: blood pressure returned to baseline and stable Postop Assessment: no apparent nausea or vomiting Anesthetic complications: no   No notable events documented.  Last Vitals:  Vitals:   03/20/24 1030 03/20/24 1100  BP: 100/72 99/68  Pulse: 75 71  Resp: 17 16  Temp:  36.7 C  SpO2:  100%    Last Pain:  Vitals:   03/20/24 1100  TempSrc: Oral  PainSc:    Pain Goal: Patients Stated Pain Goal: 0 (03/20/24 0430)                 Caliber Landess A.

## 2024-03-20 NOTE — Discharge Summary (Signed)
 Postpartum Discharge Summary  Date of Service updated***     Patient Name: Shannon Velasquez DOB: 1985/08/30 MRN: [id number]  Date of admission: 03/20/2024 Delivery date:03/20/2024 Delivering provider: MAGALI BARKLEY LITTIE Date of discharge: 03/20/2024  Admitting diagnosis: Spontaneous onset of labor after 37 but before 39 completed weeks gestation with delivery by planned cesarean section [O75.82] Intrauterine pregnancy: [redacted]w[redacted]d     Secondary diagnosis:  Principal Problem:   Spontaneous onset of labor after 37 but before 39 completed weeks gestation with delivery by planned cesarean section Active Problems:   Advanced maternal age in multigravida, third trimester   Obesity affecting pregnancy   Uterine fibroids affecting pregnancy in third trimester  Additional problems: Retained placenta after delivery    Discharge diagnosis: Term Pregnancy Delivered                                              Post partum procedures:{Postpartum procedures:23558} Augmentation: N/A Complications: {OB Labor/Delivery Complications:20784}  Hospital course: Onset of Labor With Vaginal Delivery      38 y.o. yo G3P2002 at [redacted]w[redacted]d was admitted in Latent Labor on 03/20/2024. Labor course was complicated by retained placenta after delivery.   Membrane Rupture Time/Date: 1:30 AM,03/20/2024  Delivery Method:Vaginal, Spontaneous Operative Delivery:N/A Episiotomy: None Lacerations:  None Patient had a postpartum course complicated by ***.  She is ambulating, tolerating a regular diet, passing flatus, and urinating well. Patient is discharged home in stable condition on 03/20/24.  Newborn Data: Birth date:03/20/2024 Birth time:7:45 AM Gender:Female Living status:Living Apgars:9 ,9  Weight:2930 g  Magnesium Sulfate received: {Mag received:30440022} BMZ received: No Rhophylac:N/A MMR:N/A T-DaP:N/A Flu: No RSV Vaccine received: No Transfusion:No  Immunizations received: Immunization History   Administered Date(s) Administered   Moderna Sars-Covid-2 Vaccination 06/07/2020, 07/05/2020    Physical exam  Vitals:   03/20/24 0833 03/20/24 0835 03/20/24 0838 03/20/24 0839  BP: (!) 104/46 90/60 (!) 94/52 (!) 97/52  Pulse: 91 90 88 90  Resp: 17     Temp:      TempSrc:      SpO2:      Weight:      Height:       General: {Exam; general:21111117} Lochia: {Desc; appropriate/inappropriate:30686::appropriate} Uterine Fundus: {Desc; firm/soft:30687} Incision: {Exam; incision:21111123} DVT Evaluation: {Exam; dvt:2111122} Labs: Lab Results  Component Value Date   WBC 5.7 03/20/2024   HGB 11.1 (L) 03/20/2024   HCT 34.6 (L) 03/20/2024   MCV 79.7 (L) 03/20/2024   PLT 233 03/20/2024      Latest Ref Rng & Units 03/20/2024    3:45 AM  CMP  Glucose 70 - 99 mg/dL 83   BUN 6 - 20 mg/dL 8   Creatinine 9.55 - 8.99 mg/dL 9.30   Sodium 864 - 854 mmol/L 133   Potassium 3.5 - 5.1 mmol/L 3.7   Chloride 98 - 111 mmol/L 105   CO2 22 - 32 mmol/L 20   Calcium 8.9 - 10.3 mg/dL 8.3   Total Protein 6.5 - 8.1 g/dL 6.2   Total Bilirubin 0.0 - 1.2 mg/dL 0.4   Alkaline Phos 38 - 126 U/L 105   AST 15 - 41 U/L 15   ALT 0 - 44 U/L 9    Edinburgh Score:     No data to display         No data recorded  After visit meds:  Allergies as of 03/20/2024       Reactions   Penicillins Other (See Comments)   From when younger Has patient had a PCN reaction causing immediate rash, facial/tongue/throat swelling, SOB or lightheadedness with hypotension: No Has patient had a PCN reaction causing severe rash involving mucus membranes or skin necrosis: No Has patient had a PCN reaction that required hospitalization: no Has patient had a PCN reaction occurring within the last 10 years: no If all of the above answers are NO, then may proceed with Cephalosporin use.   Wellbutrin [bupropion] Other (See Comments)   Has seizures     Med Rec must be completed prior to using this  [facility]***        Discharge home in stable condition Infant Feeding: {Baby feeding:23562} Infant Disposition:{CHL IP OB HOME WITH FNUYZM:76418} Discharge instruction: per After Visit Summary and Postpartum booklet. Activity: Advance as tolerated. Pelvic rest for 6 weeks.  Diet: {OB ipzu:78888878} Future Appointments:No future appointments. Follow up Visit:

## 2024-03-20 NOTE — Anesthesia Preprocedure Evaluation (Signed)
 Anesthesia Evaluation  Patient identified by MRN, date of birth, ID band Patient awake    Reviewed: Allergy & Precautions, Patient's Chart, lab work & pertinent test results  Airway Mallampati: III  TM Distance: >3 FB     Dental no notable dental hx. (+) Teeth Intact, Dental Advisory Given   Pulmonary neg pulmonary ROS   Pulmonary exam normal breath sounds clear to auscultation       Cardiovascular negative cardio ROS Normal cardiovascular exam Rhythm:Regular     Neuro/Psych  Headaches, Seizures -, Well Controlled,  Hx/o Sz last age 8  negative psych ROS   GI/Hepatic Neg liver ROS,GERD  Medicated,,  Endo/Other    Class 3 obesity  Renal/GU negative Renal ROS  negative genitourinary   Musculoskeletal negative musculoskeletal ROS (+)    Abdominal  (+) + obese  Peds  Hematology  (+) Blood dyscrasia, anemia   Anesthesia Other Findings   Reproductive/Obstetrics (+) Pregnancy AMA                              Anesthesia Physical Anesthesia Plan  ASA: 3 and emergent  Anesthesia Plan: General   Post-op Pain Management: Dilaudid  IV, Ofirmev  IV (intra-op)* and Precedex   Induction: Intravenous, Cricoid pressure planned and Rapid sequence  PONV Risk Score and Plan: 4 or greater and Treatment may vary due to age or medical condition, Ondansetron  and Dexamethasone   Airway Management Planned: Oral ETT and Natural Airway  Additional Equipment: None  Intra-op Plan:   Post-operative Plan: Extubation in OR  Informed Consent: I have reviewed the patients History and Physical, chart, labs and discussed the procedure including the risks, benefits and alternatives for the proposed anesthesia with the patient or authorized representative who has indicated his/her understanding and acceptance.     Dental advisory given  Plan Discussed with: Anesthesiologist, CRNA and Surgeon  Anesthesia Plan  Comments: (Epidural not working. Will proceed with GETA. EMERSON Saba, MD)         Anesthesia Quick Evaluation

## 2024-03-20 NOTE — Lactation Note (Signed)
 This note was copied from a baby's chart. Lactation Consultation Note  Patient Name: Shannon Velasquez Unijb'd Date: 03/20/2024 Age:37 hours Reason for consult: Follow-up assessment;Early term 37-38.6wks;Breastfeeding assistance;RN request  P3- RN requested assisting with latching infant. LC assisted with placing infant on the right breast in the football hold. Infant was eager and latched immediately. Infant had a strong rhythmic suck with flanged lips. [name] denied any pain or discomfort. Infant was still nursing when [facility] left room. LC encouraged [name] to call for further assistance as needed.  Maternal Data Has patient been taught Hand Expression?: No Does the patient have breastfeeding experience prior to this delivery?: Yes  Feeding Mother's Current Feeding Choice: Breast Milk  LATCH Score Latch: Grasps breast easily, tongue down, lips flanged, rhythmical sucking.  Audible Swallowing: Spontaneous and intermittent  Type of Nipple: Everted at rest and after stimulation  Comfort (Breast/Nipple): Soft / non-tender  Hold (Positioning): Assistance needed to correctly position infant at breast and maintain latch.  LATCH Score: 9   Lactation Tools Discussed/Used Pump Education: Milk Storage  Interventions Interventions: Breast feeding basics reviewed;Assisted with latch;Breast compression;Adjust position;Support pillows;Position options;Education;LC Services brochure  Discharge Discharge Education: Engorgement and breast care;Warning signs for feeding baby Pump: Referral sent for [facility] Pump  Consult Status Consult Status: Follow-up Date: 03/21/24 Follow-up type: In-patient    Recardo Hoit BS, IBCLC 03/20/2024, 6:01 PM

## 2024-03-20 NOTE — Anesthesia Procedure Notes (Signed)
 Epidural Patient location during procedure: OB Start time: 03/20/2024 7:32 AM End time: 03/20/2024 7:41 AM  Staffing Anesthesiologist: Jerrye Sharper, MD Performed: anesthesiologist   Preanesthetic Checklist Completed: patient identified, IV checked, site marked, risks and benefits discussed, surgical consent, monitors and equipment checked, pre-op evaluation and timeout performed  Epidural Patient position: sitting Prep: DuraPrep and site prepped and draped Patient monitoring: continuous pulse ox and blood pressure Approach: midline Injection technique: LOR air  Needle:  Needle type: Tuohy  Needle gauge: 17 G Needle length: 9 cm and 9 Needle insertion depth: 8 cm Catheter type: closed end flexible Catheter size: 19 Gauge Catheter at skin depth: 15 cm Test dose: negative and Other  Assessment Events: blood not aspirated, no cerebrospinal fluid, injection not painful, no injection resistance, no paresthesia and negative IV test  Additional Notes Patient identified. Risks and benefits discussed including failed block, incomplete  Pain control, post dural puncture headache, nerve damage, paralysis, blood pressure Changes, nausea, vomiting, reactions to medications-both toxic and allergic and post Partum back pain. All questions were answered. Patient expressed understanding and wished to proceed. Sterile technique was used throughout procedure. Epidural site was Dressed with sterile barrier dressing. No paresthesias, signs of intravascular injection Or signs of intrathecal spread were encountered.  Patient was more comfortable after the epidural was dosed. Please see RN's note for documentation of vital signs and FHR which are stable. Reason for block:procedure for pain

## 2024-03-20 NOTE — Anesthesia Preprocedure Evaluation (Signed)
 Anesthesia Evaluation  Patient identified by MRN, date of birth, ID band Patient awake    Reviewed: Allergy & Precautions, Patient's Chart, lab work & pertinent test results  Airway Mallampati: II       Dental no notable dental hx. (+) Teeth Intact   Pulmonary neg pulmonary ROS   Pulmonary exam normal breath sounds clear to auscultation       Cardiovascular negative cardio ROS Normal cardiovascular exam Rhythm:Regular     Neuro/Psych  Headaches, Seizures -, Well Controlled,  Hx/o Sz last age 27  negative psych ROS   GI/Hepatic Neg liver ROS,GERD  ,,  Endo/Other    Class 3 obesity  Renal/GU negative Renal ROS  negative genitourinary   Musculoskeletal negative musculoskeletal ROS (+)    Abdominal  (+) + obese  Peds  Hematology  (+) Blood dyscrasia, anemia   Anesthesia Other Findings   Reproductive/Obstetrics (+) Pregnancy AMA                              Anesthesia Physical Anesthesia Plan  ASA: 3  Anesthesia Plan: Epidural   Post-op Pain Management:    Induction: Intravenous  PONV Risk Score and Plan: Treatment may vary due to age or medical condition  Airway Management Planned: Natural Airway  Additional Equipment: None  Intra-op Plan:   Post-operative Plan:   Informed Consent: I have reviewed the patients History and Physical, chart, labs and discussed the procedure including the risks, benefits and alternatives for the proposed anesthesia with the patient or authorized representative who has indicated his/her understanding and acceptance.       Plan Discussed with: Anesthesiologist  Anesthesia Plan Comments:          Anesthesia Quick Evaluation

## 2024-03-20 NOTE — Lactation Note (Signed)
 This note was copied from a baby's chart. Lactation Consultation Note  Patient Name: Shannon Velasquez Unijb'd Date: 03/20/2024 Age:37 hours Reason for consult: Initial assessment;Early term 37-38.6wks  P3- Shannon Velasquez reports that infant has been latching well so far. Shannon Velasquez is a very experienced breastfeeding mom. Shannon Velasquez reports never having any issues with breastfeeding her first two children. LC was unable to assess a latch due to infant previously feeding. Shannon Velasquez plans to call out for  a latch assessment. Shannon Velasquez denies having any questions or concerns at this time. Shannon Velasquez reports that her only complaint is that her back hurts from the epidural. Shannon Velasquez reports that her RN recently provided her with Tylenol .   LC reviewed the first 24 hr birthday nap, day 2 cluster feeding, feeding infant on cue 8-12x in 24 hrs, not allowing infant to go over 3 hrs without a feeding, CDC milk storage guidelines, LC services handout and engorgement/breast care. LC encouraged Shannon Velasquez to call for further assistance as needed.  Maternal Data Has patient been taught Hand Expression?: No Does the patient have breastfeeding experience prior to this delivery?: Yes How long did the patient breastfeed?: 1.5 years with first child and 2 years with second child  Feeding Mother's Current Feeding Choice: Breast Milk  LATCH Score Latch: Repeated attempts needed to sustain latch, nipple held in mouth throughout feeding, stimulation needed to elicit sucking reflex.  Audible Swallowing: A few with stimulation  Type of Nipple: Everted at rest and after stimulation  Comfort (Breast/Nipple): Soft / non-tender  Hold (Positioning): Assistance needed to correctly position infant at breast and maintain latch.  LATCH Score: 7   Lactation Tools Discussed/Used Pump Education: Milk Storage  Interventions Interventions: Breast feeding basics reviewed;Education;LC Services brochure  Discharge Discharge Education: Engorgement and breast care;Warning  signs for feeding baby Pump: Referral sent for [facility] Pump  Consult Status Consult Status: Follow-up Date: 03/21/24 Follow-up type: In-patient    Recardo Hoit BS, IBCLC 03/20/2024, 12:52 PM

## 2024-03-20 NOTE — Plan of Care (Signed)

## 2024-03-21 NOTE — Anesthesia Postprocedure Evaluation (Signed)
 Anesthesia Post Note  Patient: Shannon Velasquez  Procedure(s) Performed: AN AD HOC LABOR EPIDURAL     Patient location during evaluation: Mother Baby Anesthesia Type: Epidural Level of consciousness: awake and alert and oriented Pain management: satisfactory to patient Vital Signs Assessment: post-procedure vital signs reviewed and stable Respiratory status: respiratory function stable Cardiovascular status: stable Postop Assessment: no headache, no backache, epidural receding, patient able to bend at knees, no signs of nausea or vomiting, adequate PO intake and able to ambulate Anesthetic complications: no   No notable events documented.  Last Vitals:  Vitals:   03/20/24 2157 03/21/24 0550  BP: 109/71 108/67  Pulse: 74 73  Resp: 18 18  Temp: 36.6 C 36.7 C  SpO2:      Last Pain:  Vitals:   03/21/24 0820  TempSrc:   PainSc: 0-No pain   Pain Goal: Patients Stated Pain Goal: 0 (03/20/24 0430)                 PURNELL PORTAL

## 2024-03-21 NOTE — Progress Notes (Signed)
 Post Partum Day 1 Subjective: no complaints, up ad lib, voiding, tolerating PO, and + flatus  Objective: Blood pressure 108/67, pulse 73, temperature 98.1 F (36.7 C), temperature source Oral, resp. rate 18, height 5' 3 (1.6 m), weight 115.2 kg, last menstrual period 12/22/2022, SpO2 100%, unknown if currently breastfeeding.  Physical Exam:  General: alert and cooperative Lochia: appropriate Uterine Fundus: firm Incision: healing well DVT Evaluation: No evidence of DVT seen on physical exam.  Recent Labs    03/20/24 0921 03/20/24 1407  HGB 9.9* 9.1*  HCT 29.0* 28.0*    Assessment/Plan: Plan for discharge tomorrow, Breastfeeding, and Contraception undecided   LOS: 1 day   Kalisha Keadle N Jayme Mednick, Student-MidWife 03/21/2024, 11:39 AM

## 2024-03-21 NOTE — Lactation Note (Addendum)
 This note was copied from a baby's chart. Lactation Consultation Note  Patient Name: Shannon Velasquez Date: 03/21/2024 Age:37 hours Reason for consult: Follow-up assessment;Early term 37-38.6wks  P3, 37 weeks.  Baby has been breastfeeding for 30 min.  Mother denies concerns or questions at this time. Reviewed engorgement care and monitoring voids/stools. Feed on demand with cues.  Goal 8-12+ times per day after first 24 hrs.  Place baby STS if not cueing.   Maternal Data Does the patient have breastfeeding experience prior to this delivery?: Yes  Feeding Mother's Current Feeding Choice: Breast Milk  Interventions Interventions: Breast feeding basics reviewed;Education  Discharge Discharge Education: Engorgement and breast care;Warning signs for feeding baby  Consult Status Consult Status: Complete Date: 03/21/24   Shannon Levorn Lemme  RN, IBCLC 03/21/2024, 11:50 AM

## 2024-03-22 MED ORDER — IBUPROFEN 800 MG PO TABS: 800.0000 mg | ORAL_TABLET | Freq: Three times a day (TID) | ORAL | 0 refills | Status: DC

## 2024-03-22 MED ORDER — SENNOSIDES-DOCUSATE SODIUM 8.6-50 MG PO TABS: 2.0000 | ORAL_TABLET | ORAL | 0 refills | Status: AC

## 2024-03-22 NOTE — Patient Instructions (Signed)
 Your appointment with Outpatient Lactation is: Date: 04/04/2024 Time: 8:30 AM [facility] (First Floor) [address] [place]  Check in under baby's name.  Please bring your baby hungry along with your pump and a bottle of either formula or expressed breast milk. Please also bring your pump flanges and we welcome support people! If you need lactation assistance before your appointment, please call [phone] and press 4 for lactation.     Lactation support groups:  [facility], Tuesdays 10:00 am -12:00 pm at [address] on the second floor in the conference room, lactating parents and lap babies welcome.  [email or web address]  [email or web address]   Geraldina Louder, [facility] Center for [facility]

## 2024-03-22 NOTE — Lactation Note (Signed)
 This note was copied from a baby's chart. Lactation Consultation Note  Patient Name: Shannon Velasquez Date: 03/22/2024 Age:37 hours Reason for consult: Follow-up assessment  P3, 37 weeks. Baby is breastfeeding and formula feeding. Encouraged offering the breast first. Reviewed engorgement care and monitoring voids/stools. No questions. Family has Spectra  Stork pump.   Maternal Data Has patient been taught Hand Expression?: Yes  Feeding Mother's Current Feeding Choice: Breast Milk and Formula  Interventions Interventions: Education  Discharge Discharge Education: Engorgement and breast care;Warning signs for feeding baby Pump: Received Stork Pump (Spectra  received yesterday)  Consult Status Consult Status: Complete   Shannon Levorn Lemme  RN, IBCLC 03/22/2024, 12:33 PM

## 2024-03-24 LAB — SURGICAL PATHOLOGY

## 2024-03-31 ENCOUNTER — Telehealth (HOSPITAL_COMMUNITY): Payer: Self-pay | Admitting: *Deleted

## 2024-03-31 NOTE — Telephone Encounter (Signed)
 03/31/2024  Name: SHERETHA SHADD MRN: [id number] DOB: [birth year]  Reason for Call:  Transition of Care Hospital Discharge Call  Contact Status: Patient Contact Status: Message  Language assistant needed:          Follow-Up Questions:    Van Postnatal Depression Scale:  In the Past 7 Days:    PHQ2-9 Depression Scale:     Discharge Follow-up:    Post-discharge interventions: NA  Mliss Sieve, RN 03/31/2024 10:52

## 2024-07-09 ENCOUNTER — Encounter (HOSPITAL_COMMUNITY): Payer: Self-pay

## 2024-07-09 ENCOUNTER — Emergency Department (HOSPITAL_COMMUNITY)
Admission: EM | Admit: 2024-07-09 | Discharge: 2024-07-09 | Disposition: A | Discharge status: Home / Self Care | Attending: Emergency Medicine | Admitting: Emergency Medicine

## 2024-07-09 ENCOUNTER — Emergency Department (HOSPITAL_COMMUNITY)

## 2024-07-09 ENCOUNTER — Other Ambulatory Visit: Payer: Self-pay

## 2024-07-09 DIAGNOSIS — J069 Acute upper respiratory infection, unspecified: Secondary | ICD-10-CM | POA: Diagnosis not present

## 2024-07-09 DIAGNOSIS — G43909 Migraine, unspecified, not intractable, without status migrainosus: Secondary | ICD-10-CM | POA: Diagnosis not present

## 2024-07-09 DIAGNOSIS — R519 Headache, unspecified: Secondary | ICD-10-CM | POA: Diagnosis present

## 2024-07-09 HISTORY — DX: Headache, unspecified: R51.9

## 2024-07-09 LAB — RESP PANEL BY RT-PCR (RSV, FLU A&B, COVID)  RVPGX2
Influenza A by PCR: NEGATIVE
Influenza B by PCR: NEGATIVE
Resp Syncytial Virus by PCR: NEGATIVE
SARS Coronavirus 2 by RT PCR: NEGATIVE

## 2024-07-09 LAB — CBC WITH DIFFERENTIAL/PLATELET
Abs Immature Granulocytes: 0.03 K/uL (ref 0.00–0.07)
Basophils Absolute: 0 K/uL (ref 0.0–0.1)
Basophils Relative: 1 %
Eosinophils Absolute: 0 K/uL (ref 0.0–0.5)
Eosinophils Relative: 0 %
HCT: 41 % (ref 36.0–46.0)
Hemoglobin: 13.8 g/dL (ref 12.0–15.0)
Immature Granulocytes: 0 %
Lymphocytes Relative: 47 %
Lymphs Abs: 3.8 K/uL (ref 0.7–4.0)
MCH: 30.2 pg (ref 26.0–34.0)
MCHC: 33.7 g/dL (ref 30.0–36.0)
MCV: 89.7 fL (ref 80.0–100.0)
Monocytes Absolute: 0.4 K/uL (ref 0.1–1.0)
Monocytes Relative: 5 %
Neutro Abs: 3.8 K/uL (ref 1.7–7.7)
Neutrophils Relative %: 47 %
Platelets: 296 K/uL (ref 150–400)
RBC: 4.57 MIL/uL (ref 3.87–5.11)
RDW: 13.1 % (ref 11.5–15.5)
WBC: 8.1 K/uL (ref 4.0–10.5)
nRBC: 0 % (ref 0.0–0.2)

## 2024-07-09 LAB — URINALYSIS, ROUTINE W REFLEX MICROSCOPIC
Bilirubin Urine: NEGATIVE
Glucose, UA: NEGATIVE mg/dL
Hgb urine dipstick: NEGATIVE
Ketones, ur: NEGATIVE mg/dL
Leukocytes,Ua: NEGATIVE
Nitrite: NEGATIVE
Protein, ur: NEGATIVE mg/dL
Specific Gravity, Urine: 1.01 (ref 1.005–1.030)
pH: 6 (ref 5.0–8.0)

## 2024-07-09 LAB — COMPREHENSIVE METABOLIC PANEL WITH GFR
ALT: 30 U/L (ref 0–44)
AST: 22 U/L (ref 15–41)
Albumin: 4.1 g/dL (ref 3.5–5.0)
Alkaline Phosphatase: 102 U/L (ref 38–126)
Anion gap: 14 (ref 5–15)
BUN: 13 mg/dL (ref 6–20)
CO2: 20 mmol/L — ABNORMAL LOW (ref 22–32)
Calcium: 9 mg/dL (ref 8.9–10.3)
Chloride: 104 mmol/L (ref 98–111)
Creatinine, Ser: 0.81 mg/dL (ref 0.44–1.00)
GFR, Estimated: >60 mL/min
Glucose, Bld: 101 mg/dL — ABNORMAL HIGH (ref 70–99)
Potassium: 3.5 mmol/L (ref 3.5–5.1)
Sodium: 139 mmol/L (ref 135–145)
Total Bilirubin: 0.4 mg/dL (ref 0.0–1.2)
Total Protein: 7.4 g/dL (ref 6.5–8.1)

## 2024-07-09 MED ORDER — SODIUM CHLORIDE 0.9 % IV BOLUS
1000.0000 mL | Freq: Once | INTRAVENOUS | Status: AC
  Administered 2024-07-09: 1000 mL via INTRAVENOUS

## 2024-07-09 MED ORDER — IBUPROFEN 800 MG PO TABS: 800.0000 mg | ORAL_TABLET | Freq: Three times a day (TID) | ORAL | 0 refills | Status: AC

## 2024-07-09 MED ORDER — IOHEXOL 350 MG/ML SOLN
75.0000 mL | Freq: Once | INTRAVENOUS | Status: AC | PRN
  Administered 2024-07-09: 75 mL via INTRAVENOUS

## 2024-07-09 MED ORDER — METOCLOPRAMIDE HCL 5 MG/ML IJ SOLN
10.0000 mg | Freq: Once | INTRAMUSCULAR | Status: AC
  Administered 2024-07-09: 10 mg via INTRAVENOUS
  Filled 2024-07-09: qty 2

## 2024-07-09 MED ORDER — IOHEXOL 350 MG/ML SOLN: 75.0000 mL | Freq: Once | INTRAVENOUS | Status: DC | PRN

## 2024-07-09 MED ORDER — DIPHENHYDRAMINE HCL 50 MG/ML IJ SOLN
25.0000 mg | Freq: Once | INTRAMUSCULAR | Status: AC
  Administered 2024-07-09: 25 mg via INTRAVENOUS
  Filled 2024-07-09: qty 1

## 2024-07-09 MED ORDER — KETOROLAC TROMETHAMINE 30 MG/ML IJ SOLN
15.0000 mg | Freq: Once | INTRAMUSCULAR | Status: AC
  Administered 2024-07-09: 15 mg via INTRAVENOUS
  Filled 2024-07-09: qty 1

## 2024-07-09 MED ORDER — BENZONATATE 200 MG PO CAPS: 200.0000 mg | ORAL_CAPSULE | Freq: Three times a day (TID) | ORAL | 0 refills | Status: AC | PRN

## 2024-07-09 NOTE — ED Notes (Signed)
 Pt/family received d/c paperwork at this time. After going over the paperwork any questions, comments, or concerns were answered to the best of this nurse's knowledge. The pt/family verbally acknowledged the teachings/instructions.

## 2024-07-09 NOTE — Discharge Instructions (Signed)
 Tylenol  every 4 hours for fever or body aches.  Drink plenty of water .  Follow-up with your primary provider for recheck.

## 2024-07-09 NOTE — ED Notes (Signed)
 Orthostatic VS: Laying: BP 127/84 MAP 97 HR 85 O2 99%   Sitting: BP 143/89 MAP 108 HR 76 O2 100% Pt became dizzy in a sitting position.    Standing: BP 133/95 MAP 102 HR 84 O2 100% Pt became even more dizzy after standing up. Pt was firm on her feet, no swaying or seemed unbalanced.

## 2024-07-09 NOTE — ED Triage Notes (Signed)
 Pt reports having a headache and feeling shakey and her heart was racing this morning.

## 2024-07-09 NOTE — ED Provider Notes (Signed)
 " [place] EMERGENCY DEPARTMENT AT [facility] Provider Note   CSN: [id number] Arrival date & time: 07/09/24  1709     Patient presents with: Headache   Shannon Velasquez is a 38 y.o. female.  {Add pertinent medical, surgical, social history, OB history to HPI:32947}  Headache Associated symptoms: dizziness and myalgias         Shannon Velasquez is a 38 y.o. female who presents to the Emergency Department complaining of generalized headache, feeling shaky, body aches, sneezing and feeling like her heart is racing.  Woke this morning with symptoms.  Denies any know sick contacts.  No known fever.  She denies abdominal pain, vomiting or diarrhea.  She describes the headache as throbbing from forehead to base of her skull and associated with squiggly lines across my vision.  Took Excedrin migraine earlier without relief.  Denies chest pain and shortness of breath.     Prior to Admission medications  Medication Sig Start Date End Date Taking? Authorizing Provider  ibuprofen  (ADVIL ) 800 MG tablet Take 1 tablet (800 mg total) by mouth every 8 (eight) hours. 03/22/24   Cashion, Colter L, MD  Prenatal Vit-Fe Fumarate-FA (MULTIVITAMIN-PRENATAL) 27-0.8 MG TABS tablet Take 1 tablet by mouth daily at 12 noon.    [provider]  senna-docusate (SENOKOT-S) 8.6-50 MG tablet Take 2 tablets by mouth daily. 03/22/24   Cashion, Colter L, MD    Allergies: Penicillins and Wellbutrin [bupropion]    Review of Systems  HENT:  Positive for sneezing.   Musculoskeletal:  Positive for myalgias.  Neurological:  Positive for dizziness and headaches.  All other systems reviewed and are negative.   Updated Vital Signs BP (!) 135/92 (BP Location: Right Arm)   Pulse 85   Temp 97.6 F (36.4 C)   Resp 16   Wt 95.3 kg   LMP 06/16/2024   SpO2 98%   BMI 37.20 kg/m   Physical Exam Vitals and nursing note reviewed.  Constitutional:      General: She is not in acute distress.     Appearance: She is not ill-appearing or toxic-appearing.  HENT:     Right Ear: Tympanic membrane and ear canal normal.     Left Ear: Tympanic membrane and ear canal normal.     Nose: Nose normal.     Mouth/Throat:     Mouth: Mucous membranes are moist.     Pharynx: No oropharyngeal exudate or posterior oropharyngeal erythema.  Eyes:     Extraocular Movements: Extraocular movements intact.     Conjunctiva/sclera: Conjunctivae normal.     Pupils: Pupils are equal, round, and reactive to light.  Neck:     Trachea: Phonation normal.     Meningeal: Kernig's sign absent.  Cardiovascular:     Rate and Rhythm: Normal rate and regular rhythm.     Pulses: Normal pulses.  Pulmonary:     Effort: Pulmonary effort is normal. No respiratory distress.  Chest:     Chest wall: No tenderness.  Abdominal:     Palpations: Abdomen is soft.     Tenderness: There is no abdominal tenderness.  Musculoskeletal:     Cervical back: Full passive range of motion without pain and normal range of motion. No spinous process tenderness or muscular tenderness.  Skin:    General: Skin is warm.     Capillary Refill: Capillary refill takes less than 2 seconds.  Neurological:     General: No focal deficit present.  Mental Status: She is alert.     GCS: GCS eye subscore is 4. GCS verbal subscore is 5. GCS motor subscore is 6.     Sensory: Sensation is intact. No sensory deficit.     Motor: No weakness.     Coordination: Coordination is intact.     Comments: CN II-XII intact.  Speech clear.  Mentating well.  No pronator drift, grip strength strong and symmetrical.       (all labs ordered are listed, but only abnormal results are displayed) Labs Reviewed  RESP PANEL BY RT-PCR (RSV, FLU A&B, COVID)  [id number]  CBC WITH DIFFERENTIAL/PLATELET  COMPREHENSIVE METABOLIC PANEL WITH GFR  HCG, QUANTITATIVE, PREGNANCY  URINALYSIS, ROUTINE W REFLEX MICROSCOPIC    EKG: EKG Interpretation Date/Time:  Saturday July 09 2024 17:40:51 EST Ventricular Rate:  81 PR Interval:  145 QRS Duration:  91 QT Interval:  363 QTC Calculation: 422 R Axis:   78  Text Interpretation: Sinus rhythm No old tracing to compare Confirmed by Towana Sharper [phone]) on 07/09/2024 5:48:58 PM  Radiology: No results found.  {Document cardiac monitor, telemetry assessment procedure when appropriate:32947} Procedures   Medications Ordered in the ED  sodium chloride  0.9 % bolus 1,000 mL (has no administration in time range)  ketorolac  (TORADOL ) 30 MG/ML injection 15 mg (has no administration in time range)  diphenhydrAMINE  (BENADRYL ) injection 25 mg (has no administration in time range)  metoCLOPramide  (REGLAN ) injection 10 mg (has no administration in time range)      {Click here for ABCD2, HEART and other calculators REFRESH Note before signing:1}                              Medical Decision Making   Pt her with sneezing, HA, body aches, feeling racing of her heart and shaky inside.  Sx's began this morning.  No fever, stiff neck or vomiting.    Amount and/or Complexity of Data Reviewed Labs: ordered.  Risk Prescription drug management.   ***  {Document critical care time when appropriate  Document review of labs and clinical decision tools ie CHADS2VASC2, etc  Document your independent review of radiology images and any outside records  Document your discussion with family members, caretakers and with consultants  Document social determinants of health affecting pt's care  Document your decision making why or why not admission, treatments were needed:32947:::1}   Final diagnoses:  None    ED Discharge Orders     None        "

## 2024-07-10 LAB — HCG, QUANTITATIVE, PREGNANCY: hCG, Beta Chain, Quant, S: <1 m[IU]/mL
